# Patient Record
Sex: Female | Born: 1995 | Race: Black or African American | Hispanic: No | Smoking: Never smoker
Health system: Southern US, Community
[De-identification: ages and names within clinical notes are randomized; demographics above are authoritative.]

## PROBLEM LIST (undated history)

## (undated) DIAGNOSIS — J982 Interstitial emphysema: Principal | ICD-10-CM

## (undated) DIAGNOSIS — J45909 Unspecified asthma, uncomplicated: Secondary | ICD-10-CM

---

## 2016-11-03 ENCOUNTER — Emergency Department (HOSPITAL_COMMUNITY)
Admission: EM | Admit: 2016-11-03 | Discharge: 2016-11-03 | Disposition: A | Discharge status: Home / Self Care | Payer: Self-pay | Attending: Emergency Medicine | Admitting: Emergency Medicine

## 2016-11-03 ENCOUNTER — Encounter (HOSPITAL_COMMUNITY): Payer: Self-pay

## 2016-11-03 DIAGNOSIS — J4521 Mild intermittent asthma with (acute) exacerbation: Secondary | ICD-10-CM | POA: Insufficient documentation

## 2016-11-03 HISTORY — DX: Unspecified asthma, uncomplicated: J45.909

## 2016-11-03 MED ORDER — ALBUTEROL SULFATE HFA 108 (90 BASE) MCG/ACT IN AERS
2.0000 | INHALATION_SPRAY | Freq: Once | RESPIRATORY_TRACT | Status: AC
  Administered 2016-11-03: 2 via RESPIRATORY_TRACT
  Filled 2016-11-03: qty 6.7

## 2016-11-03 MED ORDER — IPRATROPIUM BROMIDE 0.02 % IN SOLN
0.5000 mg | Freq: Once | RESPIRATORY_TRACT | Status: AC
  Administered 2016-11-03: 0.5 mg via RESPIRATORY_TRACT
  Filled 2016-11-03: qty 2.5

## 2016-11-03 MED ORDER — PREDNISONE 20 MG PO TABS
60.0000 mg | ORAL_TABLET | Freq: Once | ORAL | Status: AC
  Administered 2016-11-03: 60 mg via ORAL
  Filled 2016-11-03: qty 3

## 2016-11-03 MED ORDER — ALBUTEROL SULFATE (2.5 MG/3ML) 0.083% IN NEBU
5.0000 mg | INHALATION_SOLUTION | Freq: Once | RESPIRATORY_TRACT | Status: AC
  Administered 2016-11-03: 5 mg via RESPIRATORY_TRACT
  Filled 2016-11-03: qty 6

## 2016-11-03 MED ORDER — PREDNISONE 20 MG PO TABS: 60.0000 mg | ORAL_TABLET | Freq: Every day | ORAL | 0 refills | Status: DC

## 2016-11-03 NOTE — Discharge Instructions (Signed)
You may use your albuterol inhaler 2-4 puffs every 2-4 hours as needed for shortness of breath and wheezing.   To find a primary care or specialty doctor please call [phone] or [phone] to access "[place] Find a Doctor Service."  You may also go on the [facility] website at [email or web address]  There are also multiple [facility], [facility], [facility] and [facility] practices throughout the [place] that are frequently accepting new patients. You may find a clinic that is close to your home and contact them.  [facility] -  [address] [place] [id number] [phone]   [facility] -  [address] [place] [id number] [phone]   [facility] [phone]  [place] [phone]

## 2016-11-03 NOTE — ED Triage Notes (Signed)
States woke up about 0200 am with tight chest and asthma flaring up tonight and doesn't have and inhaler with shortness of breath.

## 2016-11-03 NOTE — ED Provider Notes (Signed)
TIME SEEN: 5:33 AM  CHIEF COMPLAINT: Asthma exacerbation  HPI: Patient is a 21 year old female with history of asthma who presents emergency department with chest tightness, wheezing and dry cough that started early this morning. States that this is typical for her this time of year. No fevers. No lower extremity swelling or pain. This is typical of her asthma. Reports that her insurance does not cover her albuterol inhaler or nebulizer treatments.  ROS: See HPI Constitutional: no fever  Eyes: no drainage  ENT: no runny nose   Cardiovascular:  no chest pain  Resp: no SOB  GI: no vomiting GU: no dysuria Integumentary: no rash  Allergy: no hives  Musculoskeletal: no leg swelling  Neurological: no slurred speech ROS otherwise negative  PAST MEDICAL HISTORY/PAST SURGICAL HISTORY:  Past Medical History:  Diagnosis Date  . Asthma     MEDICATIONS:  Prior to Admission medications   Not on File    ALLERGIES:  Allergies  Allergen Reactions  . Sulfa Antibiotics     SOCIAL HISTORY:  Social History  Substance Use Topics  . Smoking status: Never Smoker  . Smokeless tobacco: Never Used  . Alcohol use No    FAMILY HISTORY: History reviewed. No pertinent family history.  EXAM: BP 137/79 (BP Location: Left Arm)   Pulse (!) 107   Temp 98 F (36.7 C) (Oral)   Resp 16   Ht 5' 1.5" (1.562 m)   Wt 81.6 kg (180 lb)   SpO2 97%   BMI 33.46 kg/m  CONSTITUTIONAL: Alert and oriented and responds appropriately to questions. Well-appearing; well-nourished, smiling, laughing, appears comfortable, afebrile and nontoxic HEAD: Normocephalic EYES: Conjunctivae clear, pupils appear equal, EOMI ENT: normal nose; moist mucous membranes NECK: Supple, no meningismus, no nuchal rigidity, no LAD  CARD: Regular and minimally tachycardic; S1 and S2 appreciated; no murmurs, no clicks, no rubs, no gallops RESP: Normal chest excursion without splinting or tachypnea; breath sounds equal bilaterally,  diffuse scattered expiratory wheezes, no rhonchi, no rales, no hypoxia or respiratory distress, speaking full sentences ABD/GI: Normal bowel sounds; non-distended; soft, non-tender, no rebound, no guarding, no peritoneal signs, no hepatosplenomegaly BACK:  The back appears normal and is non-tender to palpation, there is no CVA tenderness EXT: Normal ROM in all joints; non-tender to palpation; no edema; normal capillary refill; no cyanosis, no calf tenderness or swelling    SKIN: Normal color for age and race; warm; no rash NEURO: Moves all extremities equally PSYCH: The patient's mood and manner are appropriate. Grooming and personal hygiene are appropriate.  MEDICAL DECISION MAKING: Patient here with mild asthma exacerbation. She is very well-appearing here. No fevers or cough. Do not feel she needs a chest x-ray. She agrees with this plan. Will treat symptoms with albuterol, Atrovent, prednisone. We'll discharge with albuterol inhaler.  ED PROGRESS: 6:10 AM  Pt's lungs are now completely clear. She reports she feels "20 times better". I feel she is safe to be discharged home and she is comfortable with this plan. She's been given an albuterol inhaler from the emergency department and will discharge with prednisone burst. Given outpatient follow-up information as needed.   At this time, I do not feel there is any life-threatening condition present. I have reviewed and discussed all results (EKG, imaging, lab, urine as appropriate) and exam findings with patient/family. I have reviewed nursing notes and appropriate previous records.  I feel the patient is safe to be discharged home without further emergent workup and can continue workup as an  outpatient as needed. Discussed usual and customary return precautions. Patient/family verbalize understanding and are comfortable with this plan.  Outpatient follow-up has been provided if needed. All questions have been answered.      Kahlen Boyde, Layla MawKristen N,  DO 11/03/16 [phone]

## 2018-09-08 ENCOUNTER — Other Ambulatory Visit: Payer: Self-pay

## 2018-09-08 ENCOUNTER — Emergency Department (HOSPITAL_COMMUNITY)
Admission: EM | Admit: 2018-09-08 | Discharge: 2018-09-08 | Disposition: A | Discharge status: Home / Self Care | Payer: Self-pay | Attending: Ophthalmology | Admitting: Ophthalmology

## 2018-09-08 DIAGNOSIS — R0602 Shortness of breath: Secondary | ICD-10-CM | POA: Insufficient documentation

## 2018-09-08 DIAGNOSIS — R0789 Other chest pain: Secondary | ICD-10-CM | POA: Insufficient documentation

## 2018-09-08 DIAGNOSIS — J4551 Severe persistent asthma with (acute) exacerbation: Secondary | ICD-10-CM | POA: Insufficient documentation

## 2018-09-08 MED ORDER — ALBUTEROL SULFATE HFA 108 (90 BASE) MCG/ACT IN AERS
2.0000 | INHALATION_SPRAY | RESPIRATORY_TRACT | Status: DC | PRN
  Administered 2018-09-08: 2 via RESPIRATORY_TRACT
  Filled 2018-09-08: qty 6.7

## 2018-09-08 MED ORDER — PREDNISONE 10 MG (21) PO TBPK: ORAL_TABLET | Freq: Every day | ORAL | 0 refills | Status: DC

## 2018-09-08 MED ORDER — ALBUTEROL SULFATE (2.5 MG/3ML) 0.083% IN NEBU
5.0000 mg | INHALATION_SOLUTION | Freq: Once | RESPIRATORY_TRACT | Status: AC
  Administered 2018-09-08: 5 mg via RESPIRATORY_TRACT
  Filled 2018-09-08: qty 6

## 2018-09-08 MED ORDER — MONTELUKAST SODIUM 10 MG PO TABS: 10.0000 mg | ORAL_TABLET | Freq: Every day | ORAL | 0 refills | Status: DC

## 2018-09-08 MED ORDER — ALBUTEROL SULFATE (2.5 MG/3ML) 0.083% IN NEBU: 2.5000 mg | INHALATION_SOLUTION | Freq: Four times a day (QID) | RESPIRATORY_TRACT | 12 refills | Status: DC | PRN

## 2018-09-08 MED ORDER — BUDESONIDE-FORMOTEROL FUMARATE 80-4.5 MCG/ACT IN AERO: 2.0000 | INHALATION_SPRAY | Freq: Two times a day (BID) | RESPIRATORY_TRACT | 12 refills | Status: DC

## 2018-09-08 MED ORDER — PREDNISONE 20 MG PO TABS
60.0000 mg | ORAL_TABLET | Freq: Once | ORAL | Status: AC
  Administered 2018-09-08: 07:00:00 60 mg via ORAL
  Filled 2018-09-08: qty 3

## 2018-09-08 MED ORDER — IPRATROPIUM BROMIDE 0.02 % IN SOLN
0.5000 mg | Freq: Once | RESPIRATORY_TRACT | Status: AC
  Administered 2018-09-08: 07:00:00 0.5 mg via RESPIRATORY_TRACT
  Filled 2018-09-08: qty 2.5

## 2018-09-08 NOTE — TOC Transition Note (Signed)
Transition of Care [facility]) - CM/SW Discharge Note   Patient Details  Name: Rachel Montgomery MRN: [id number] Date of Birth: [birth year]  [facility]) CM/SW Contact:  Fuller Mandril, RN Phone Number: 09/08/2018, 9:07 AM   Clinical Narrative:    [facility] to follow for disposition needs.    Final next level of care: Home/Self Care Barriers to Discharge: Barriers Resolved   Patient Goals and CMS Choice Patient states their goals for this hospitalization and ongoing recovery are:: get primary care and medicine to controll asthma      Discharge Placement                       Discharge Plan and Services   Discharge Planning Services: CM Consult, [facility] Program, Follow-up appt scheduled              Lenward Able J. Clydene Laming, RN, BSN, [place] [phone]  [facility] set up appointment with [facility] on 09/22/2018 @ 1:30.  Spoke with pt at bedside and advised to please arrive 15 min early and take a picture ID and your current medications.  Pt verbalizes understanding of keeping appointment.   ED CM consulted by EDP Lawyer for medication assistance. EDCM reviewed chart and spoke with the pt about [facility] MATCH program ($3 co pay for each Rx through [facility] program, does not include refills, 7 day expiration of [place] letter and choice of pharmacies). Pt is eligible for [facility] MATCH program (unable to find pt listed in PROCARE per cardholder name inquiry) and has agreed to accept [facility] under terms discussed. PROCARE information entered. [place] letter completed and provided to pt.  EDCM updated EDP and ED RN.                     Social Determinants of Health (SDOH) Interventions     Readmission Risk Interventions No flowsheet data found.

## 2018-09-08 NOTE — ED Provider Notes (Signed)
[facility] EMERGENCY DEPARTMENT Provider Note   CSN: [id number] Arrival date & time: 09/08/18  0305     History   Chief Complaint Chief Complaint  Patient presents with  . Asthma    HPI Rachel Montgomery is a 23 y.o. female with a history of asthma who presents to the emergency department with a chief complaint of asthma exacerbation.  The patient endorses constant shortness of breath, wheezing, chest tightness, and nonproductive cough that is been worsening over the last 24 hours.  No fever, chills, abdominal pain, nasal congestion, loss of sense of taste or smell. No known or suspected COVID-19 contacts.  She reports 2 prior hospitalizations for asthma.  No previous ICU hospitalizations or intubations.  She reports that she just moved to the area from [place], [place] and ran out of her home Symbicort, albuterol inhaler, albuterol nebulizer, and montelukast.  She is currently uninsured and does not have a primary care provider.      The history is provided by the patient. No language interpreter was used.    Past Medical History:  Diagnosis Date  . Asthma     There are no active problems to display for this patient.   No past surgical history on file.   OB History   No obstetric history on file.      Home Medications    Prior to Admission medications   Medication Sig Start Date End Date Taking? Authorizing Provider  predniSONE (DELTASONE) 20 MG tablet Take 3 tablets (60 mg total) by mouth daily. 11/03/16   Ward, Delice Bison, DO    Family History No family history on file.  Social History Social History   Tobacco Use  . Smoking status: Never Smoker  . Smokeless tobacco: Never Used  Substance Use Topics  . Alcohol use: No  . Drug use: No     Allergies   Sulfa antibiotics   Review of Systems Review of Systems  Constitutional: Negative for activity change, chills and fever.  HENT: Negative for congestion, sore throat  and voice change.   Respiratory: Positive for cough, chest tightness, shortness of breath and wheezing. Negative for apnea, choking and stridor.   Cardiovascular: Negative for chest pain, palpitations and leg swelling.  Gastrointestinal: Negative for abdominal pain, blood in stool, diarrhea, nausea and vomiting.  Genitourinary: Negative for dysuria.  Musculoskeletal: Negative for back pain, myalgias and neck stiffness.  Skin: Negative for rash.  Allergic/Immunologic: Negative for immunocompromised state.  Neurological: Negative for dizziness, seizures, syncope, weakness, numbness and headaches.  Psychiatric/Behavioral: Negative for confusion.     Physical Exam Updated Vital Signs BP 139/78 (BP Location: Right Wrist)   Pulse 85   Temp 98.5 F (36.9 C) (Oral)   Resp 18   LMP 09/03/2018 (Exact Date)   SpO2 98%   Physical Exam Vitals signs and nursing note reviewed.  Constitutional:      General: She is not in acute distress.    Appearance: She is not ill-appearing, toxic-appearing or diaphoretic.  HENT:     Head: Normocephalic.  Eyes:     Conjunctiva/sclera: Conjunctivae normal.  Neck:     Musculoskeletal: Neck supple.  Cardiovascular:     Rate and Rhythm: Normal rate and regular rhythm.     Heart sounds: No murmur. No friction rub. No gallop.   Pulmonary:     Effort: Pulmonary effort is normal. No respiratory distress.     Comments: Lung sounds are diminished with diffuse bilateral wheezes.  No  rhonchi or rales.  No accessory muscle use, nasal flaring, or respiratory distress. Dry cough is heard frequently throughout exam.  Abdominal:     General: There is no distension.     Palpations: Abdomen is soft.  Skin:    General: Skin is warm.     Capillary Refill: Capillary refill takes less than 2 seconds.     Findings: No rash.  Neurological:     Mental Status: She is alert.  Psychiatric:        Behavior: Behavior normal.      ED Treatments / Results  Labs (all labs  ordered are listed, but only abnormal results are displayed) Labs Reviewed - No data to display  EKG None  Radiology No results found.  Procedures Procedures (including critical care time)  Medications Ordered in ED Medications  albuterol (PROVENTIL) (2.5 MG/3ML) 0.083% nebulizer solution 5 mg (has no administration in time range)  ipratropium (ATROVENT) nebulizer solution 0.5 mg (has no administration in time range)  predniSONE (DELTASONE) tablet 60 mg (has no administration in time range)  albuterol (VENTOLIN HFA) 108 (90 Base) MCG/ACT inhaler 2 puff (has no administration in time range)     Initial Impression / Assessment and Plan / ED Course  I have reviewed the triage vital signs and the nursing notes.  Pertinent labs & imaging results that were available during my care of the patient were reviewed by me and considered in my medical decision making (see chart for details).        23 year old female with a history of asthma presenting with asthma exacerbation that is been worsening over the last 24 hours since running out of her home medications.  Symptoms include chest tightness, wheezing, nonproductive cough, and shortness of breath.  [place].  Unfortunately, she does not have medical insurance nor does she have a primary care provider.  SaO2 is in the high 90s at rest.  Lung sounds are diminished throughout and there are sporadic, diffuse, bilateral wheezes.  Will order albuterol nebulizer and plan to reassess.  Albuterol inhaler for home use has also been ordered.  Case management consult has been ordered to help with medication assistance.  The patient will likely need a recheck following nebulizer and ambulated on pulse ox prior to discharge.  [facility] to [facility] and recommended tentatively is 7 to all nights at the end of my shift. Patient presentation, ED course, and plan of care discussed  with review of all pertinent labs and imaging. Please see his/her note for further details regarding further ED course and disposition. Late late late  Final Clinical Impressions(s) / ED Diagnoses   Final diagnoses:  Severe persistent asthma with exacerbation    ED Discharge Orders    None       Barkley BoardsMcDonald, Shanai Lartigue A, PA-C 09/08/18 0723    Dione BoozeGlick, David, MD 09/08/18 [phone]

## 2018-09-08 NOTE — ED Triage Notes (Signed)
Per pt she has hx of asthma and today has been tight today with a sllight wheeze. Pt said no sob, no chest pain. Pt said she has run out of her medication so needed TO be seen to get meds.

## 2018-09-08 NOTE — TOC Progression Note (Signed)
Transition of Care [facility]) - Progression Note    Patient Details  Name: Elysha Milson MRN: [id number] Date of Birth: Tunisia 10, 1997  [facility]) CM/SW Contact  Fuller Mandril, RN Phone Number: 09/08/2018, 9:06 AM  Clinical Narrative:    [facility] met with pt at bedside regarding disposition needs.  Pt states she just moved to the area and has not secured a PCP or insurance.     Expected Discharge Plan: Home/Self Care Barriers to Discharge: Barriers Resolved  Expected Discharge Plan and Services Expected Discharge Plan: Home/Self Care   Discharge Planning Services: CM Consult, [place] Program, Follow-up appt scheduled   Living arrangements for the past 2 months: Single Family Home Expected Discharge Date: 09/08/18                                     Social Determinants of Health (SDOH) Interventions    Readmission Risk Interventions No flowsheet data found.

## 2018-09-08 NOTE — Discharge Instructions (Addendum)
Thank you for allowing me to care for you today in the Emergency Department.   Take your home medications as prescribed.  I have given you a one-month refill for all of your home medications.  Return to the emergency department if you develop respiratory distress, severe shortness of breath, high fevers or shortness of breath, or other new, concerning symptoms.

## 2018-09-08 NOTE — TOC Initial Note (Signed)
Transition of Care [facility]) - Initial/Assessment Note    Patient Details  Name: Rachel Montgomery MRN: [id number] Date of Birth: 1995-08-15  [facility]) CM/SW Contact:    Fuller Mandril, RN Phone Number: 09/08/2018, 9:05 AM  Clinical Narrative:                 [facility] consulted regarding uninsured pt needing PCP and medication assistance.  Expected Discharge Plan: Home/Self Care Barriers to Discharge: Barriers Resolved   Patient Goals and CMS Choice Patient states their goals for this hospitalization and ongoing recovery are:: get primary care and medicine to controll asthma      Expected Discharge Plan and Services Expected Discharge Plan: Home/Self Care   Discharge Planning Services: CM Consult, [facility] Program, Follow-up appt scheduled   Living arrangements for the past 2 months: Single Family Home Expected Discharge Date: 09/08/18                                    Prior Living Arrangements/Services Living arrangements for the past 2 months: Single Family Home Lives with:: Significant Other Patient language and need for interpreter reviewed:: Yes Do you feel safe going back to the place where you live?: Yes      Need for Family Participation in Patient Care: No (Comment) Care giver support system in place?: Yes (comment)   Criminal Activity/Legal Involvement Pertinent to Current Situation/Hospitalization: No - Comment as needed  Activities of Daily Living      Permission Sought/Granted   Permission granted to share information with : Yes, Verbal Permission Granted              Emotional Assessment Appearance:: Appears stated age Attitude/Demeanor/Rapport: Ambitious, Engaged, Self-Confident, Gracious Affect (typically observed): Appropriate, Accepting, Pleasant Orientation: : Oriented to Self, Oriented to  Time, Oriented to Situation, Oriented to Place Alcohol / Substance Use: Never Used Psych Involvement: No (comment)  Admission  diagnosis:  asthma There are no active problems to display for this patient.  PCP:  System, Pcp Not In Pharmacy:  No Pharmacies Listed    Social Determinants of Health (SDOH) Interventions    Readmission Risk Interventions No flowsheet data found.

## 2018-09-22 ENCOUNTER — Inpatient Hospital Stay (INDEPENDENT_AMBULATORY_CARE_PROVIDER_SITE_OTHER): Payer: Self-pay | Admitting: Primary Care

## 2018-12-21 ENCOUNTER — Emergency Department (HOSPITAL_COMMUNITY)
Admission: EM | Admit: 2018-12-21 | Discharge: 2018-12-21 | Disposition: A | Discharge status: Home / Self Care | Payer: Self-pay | Attending: Emergency Medicine | Admitting: Emergency Medicine

## 2018-12-21 ENCOUNTER — Encounter (HOSPITAL_COMMUNITY): Payer: Self-pay | Admitting: Emergency Medicine

## 2018-12-21 ENCOUNTER — Encounter (HOSPITAL_COMMUNITY): Payer: Self-pay

## 2018-12-21 ENCOUNTER — Other Ambulatory Visit: Payer: Self-pay

## 2018-12-21 DIAGNOSIS — J45909 Unspecified asthma, uncomplicated: Secondary | ICD-10-CM | POA: Insufficient documentation

## 2018-12-21 DIAGNOSIS — Z760 Encounter for issue of repeat prescription: Secondary | ICD-10-CM | POA: Insufficient documentation

## 2018-12-21 DIAGNOSIS — Z79899 Other long term (current) drug therapy: Secondary | ICD-10-CM | POA: Insufficient documentation

## 2018-12-21 DIAGNOSIS — Z5321 Procedure and treatment not carried out due to patient leaving prior to being seen by health care provider: Secondary | ICD-10-CM | POA: Insufficient documentation

## 2018-12-21 MED ORDER — ALBUTEROL SULFATE HFA 108 (90 BASE) MCG/ACT IN AERS: 1.0000 | INHALATION_SPRAY | Freq: Four times a day (QID) | RESPIRATORY_TRACT | 0 refills | Status: DC | PRN

## 2018-12-21 MED ORDER — ALBUTEROL SULFATE HFA 108 (90 BASE) MCG/ACT IN AERS
2.0000 | INHALATION_SPRAY | Freq: Once | RESPIRATORY_TRACT | Status: DC
  Filled 2018-12-21: qty 6.7

## 2018-12-21 MED ORDER — BUDESONIDE-FORMOTEROL FUMARATE 80-4.5 MCG/ACT IN AERO: 2.0000 | INHALATION_SPRAY | Freq: Every day | RESPIRATORY_TRACT | 0 refills | Status: AC

## 2018-12-21 NOTE — ED Triage Notes (Signed)
Pt reports "my asthma has been acting up and I don't have any refills on my medication."  "this is my typical asthma"

## 2018-12-21 NOTE — ED Notes (Signed)
Pt verbalized discharge instructions and follow up care. Alert and ambulatory. No IV.  

## 2018-12-21 NOTE — ED Provider Notes (Signed)
[facility]-EMERGENCY DEPT Provider Note   CSN: [id number] Arrival date & time: 12/21/18  0201     History   Chief Complaint Chief Complaint  Patient presents with  . Asthma    HPI Rachel Montgomery is a 23 y.o. female.     The history is provided by the patient and medical records.  Asthma     23 year old female with history of asthma, presenting to the ED requesting refill of her medications.  States she recently moved and ran out of her Symbicort.  She has been having issues getting this refilled as it is over $300 at her pharmacy.  States usually when she has her Symbicort it keeps her at a "steady state" and she does not have flareups quite so often.  She does report her asthma is generally worse this time of year during cold weather.  She has not had any cough, fever, sick contacts, or known Covid exposures.  She is also out of her albuterol inhaler but does have her albuterol nebulizer solution.  Past Medical History:  Diagnosis Date  . Asthma     There are no active problems to display for this patient.   History reviewed. No pertinent surgical history.   OB History   No obstetric history on file.      Home Medications    Prior to Admission medications   Medication Sig Start Date End Date Taking? Authorizing Provider  albuterol (PROVENTIL) (2.5 MG/3ML) 0.083% nebulizer solution Take 3 mLs (2.5 mg total) by nebulization every 6 (six) hours as needed for wheezing or shortness of breath. 09/08/18   McDonald, Mia A, PA-C  budesonide-formoterol (SYMBICORT) 80-4.5 MCG/ACT inhaler Inhale 2 puffs into the lungs 2 (two) times daily. 09/08/18 10/08/18  McDonald, Mia A, PA-C  montelukast (SINGULAIR) 10 MG tablet Take 1 tablet (10 mg total) by mouth at bedtime. 09/08/18 10/08/18  McDonald, Mia A, PA-C  predniSONE (STERAPRED UNI-PAK 21 TAB) 10 MG (21) TBPK tablet Take by mouth daily. Take 6 tabs by mouth daily  for 2 days, then 5 tabs for 2 days, then  4 tabs for 2 days, then 3 tabs for 2 days, 2 tabs for 2 days, then 1 tab by mouth daily for 2 days 09/08/18   Frederik Pear A, PA-C    Family History No family history on file.  Social History Social History   Tobacco Use  . Smoking status: Never Smoker  . Smokeless tobacco: Never Used  Substance Use Topics  . Alcohol use: No  . Drug use: No     Allergies   Sulfa antibiotics   Review of Systems Review of Systems  Constitutional:       Med refill  All other systems reviewed and are negative.    Physical Exam Updated Vital Signs BP 120/88 (BP Location: Left Arm)   Pulse 93   Temp 98.1 F (36.7 C) (Oral)   Resp 14   Ht 5\' 1"  (1.549 m)   Wt 96.6 kg   SpO2 98%   BMI 40.25 kg/m   Physical Exam Vitals signs and nursing note reviewed.  Constitutional:      Appearance: She is well-developed.  HENT:     Head: Normocephalic and atraumatic.  Eyes:     Conjunctiva/sclera: Conjunctivae normal.     Pupils: Pupils are equal, round, and reactive to light.  Neck:     Musculoskeletal: Normal range of motion.  Cardiovascular:     Rate and Rhythm: Normal rate  and regular rhythm.     Heart sounds: Normal heart sounds.  Pulmonary:     Effort: Pulmonary effort is normal. No respiratory distress.     Breath sounds: Normal breath sounds. No stridor. No wheezing or rhonchi.     Comments: Lungs clear, no distress, speaking in full sentences without difficulty Abdominal:     General: Bowel sounds are normal.     Palpations: Abdomen is soft.  Musculoskeletal: Normal range of motion.  Skin:    General: Skin is warm and dry.  Neurological:     Mental Status: She is alert and oriented to person, place, and time.      ED Treatments / Results  Labs (all labs ordered are listed, but only abnormal results are displayed) Labs Reviewed - No data to display  EKG None  Radiology No results found.  Procedures Procedures (including critical care time)  Medications Ordered  in ED Medications  albuterol (VENTOLIN HFA) 108 (90 Base) MCG/ACT inhaler 2 puff (has no administration in time range)     Initial Impression / Assessment and Plan / ED Course  I have reviewed the triage vital signs and the nursing notes.  Pertinent labs & imaging results that were available during my care of the patient were reviewed by me and considered in my medical decision making (see chart for details).  23 year old female here requesting refills of her medications for her asthma.  Has been out of them for a few days now and has had some worsening symptoms.  She is afebrile and nontoxic.  Her lungs are currently clear during time of exam and she is not displaying any signs of respiratory distress.  She has not had any noted fevers, sick contacts, or known Covid exposures.  Does admit she has had some difficulty refilling her medications due to out-of-pocket cost.  I have printed goodRx coupons for her to make these more affordable and given her outpatient prescriptions for same.  Recommended close follow-up with PCP.  Return here for any new or acute changes.  Final Clinical Impressions(s) / ED Diagnoses   Final diagnoses:  Medication refill    ED Discharge Orders         Ordered    albuterol (VENTOLIN HFA) 108 (90 Base) MCG/ACT inhaler  Every 6 hours PRN     12/21/18 0221    budesonide-formoterol (SYMBICORT) 80-4.5 MCG/ACT inhaler  Daily     12/21/18 0221           Larene Pickett, PA-C 12/21/18 0932    Orpah Greek, MD 12/21/18 [phone]

## 2018-12-21 NOTE — ED Triage Notes (Signed)
Patient arrived stating since Monday morning she has been having trouble managing her asthma due to running out of her inhaler. Patient in no distress in triage.

## 2018-12-21 NOTE — ED Notes (Signed)
Pt stated her mother is taking her to another hospital.

## 2018-12-21 NOTE — Discharge Instructions (Signed)
Take the prescribed medication as directed.  Use the coupons I have printed for you, they are good at any [facility] pharmacy. Follow-up with your primary care doctor. Return to the ED for new or worsening symptoms.

## 2018-12-21 NOTE — ED Notes (Signed)
Pt ambulated from triage to room 17 without assistance. Gait steady  

## 2018-12-26 ENCOUNTER — Encounter (HOSPITAL_COMMUNITY): Payer: Self-pay | Admitting: Emergency Medicine

## 2018-12-26 ENCOUNTER — Other Ambulatory Visit: Payer: Self-pay

## 2018-12-26 DIAGNOSIS — J4521 Mild intermittent asthma with (acute) exacerbation: Secondary | ICD-10-CM | POA: Insufficient documentation

## 2018-12-26 MED ORDER — ALBUTEROL SULFATE (2.5 MG/3ML) 0.083% IN NEBU
5.0000 mg | INHALATION_SOLUTION | Freq: Once | RESPIRATORY_TRACT | Status: AC
  Administered 2018-12-26: 5 mg via RESPIRATORY_TRACT
  Filled 2018-12-26: qty 6

## 2018-12-26 NOTE — ED Triage Notes (Signed)
Pt arriving with SOB due to asthma. Pt reports symptoms began earlier today and she has taken multiple breathing tx without relief.

## 2018-12-26 NOTE — ED Notes (Signed)
Breathing tx complete. Pt reports improvement of symptoms. Following assessment, pt stable and appropriate to wait in lobby. Informed pt that if she begins to feel her symptoms worsening, to inform staff immediately.

## 2018-12-27 ENCOUNTER — Emergency Department (HOSPITAL_COMMUNITY)
Admission: EM | Admit: 2018-12-27 | Discharge: 2018-12-27 | Disposition: A | Discharge status: Home / Self Care | Payer: Self-pay | Attending: Emergency Medicine | Admitting: Emergency Medicine

## 2018-12-27 DIAGNOSIS — J4521 Mild intermittent asthma with (acute) exacerbation: Secondary | ICD-10-CM

## 2018-12-27 MED ORDER — ALBUTEROL SULFATE (2.5 MG/3ML) 0.083% IN NEBU
5.0000 mg | INHALATION_SOLUTION | Freq: Once | RESPIRATORY_TRACT | Status: AC
  Administered 2018-12-27: 5 mg via RESPIRATORY_TRACT
  Filled 2018-12-27: qty 6

## 2018-12-27 MED ORDER — PREDNISONE 20 MG PO TABS
60.0000 mg | ORAL_TABLET | Freq: Once | ORAL | Status: AC
  Administered 2018-12-27: 60 mg via ORAL
  Filled 2018-12-27: qty 3

## 2018-12-27 MED ORDER — PREDNISONE 20 MG PO TABS: ORAL_TABLET | ORAL | 0 refills | Status: DC

## 2018-12-27 MED ORDER — IPRATROPIUM BROMIDE 0.02 % IN SOLN
0.5000 mg | Freq: Once | RESPIRATORY_TRACT | Status: AC
  Administered 2018-12-27: 0.5 mg via RESPIRATORY_TRACT
  Filled 2018-12-27: qty 2.5

## 2018-12-27 NOTE — ED Provider Notes (Signed)
[facility]-EMERGENCY DEPT Provider Note   CSN: [id number] Arrival date & time: 12/26/18  2148     History Chief Complaint  Patient presents with  . Asthma    Rachel Montgomery is a 23 y.o. female.  The history is provided by the patient and medical records.  Asthma Associated symptoms include shortness of breath.    23 year old female here with asthma exacerbation.  I personally evaluated patient last week for similar related complaints.  She was given prescriptions for her daily inhalers at last visit, however does not get paid until Friday so has not been able to fill them.  States her symptoms seem a little worse, has been doing nebs about every hour and 1/2 to 2 hours.  She was given 2 nebs on arrival to ED and states she is feeling significantly better.  She has not had any fever, chills, sweats, hemoptysis, or other concerning symptoms.  She has not had any chest pain.  Symptoms feel consistent with her asthma.  She is not had any sick contacts or known Covid exposures.  Past Medical History:  Diagnosis Date  . Asthma     There are no problems to display for this patient.   History reviewed. No pertinent surgical history.   OB History   No obstetric history on file.     History reviewed. No pertinent family history.  Social History   Tobacco Use  . Smoking status: Never Smoker  . Smokeless tobacco: Never Used  Substance Use Topics  . Alcohol use: No  . Drug use: No    Home Medications Prior to Admission medications   Medication Sig Start Date End Date Taking? Authorizing Provider  albuterol (VENTOLIN HFA) 108 (90 Base) MCG/ACT inhaler Inhale 1-2 puffs into the lungs every 6 (six) hours as needed for wheezing. 12/21/18   Garlon Hatchet, PA-C  budesonide-formoterol (SYMBICORT) 80-4.5 MCG/ACT inhaler Inhale 2 puffs into the lungs daily. 12/21/18   Garlon Hatchet, PA-C  montelukast (SINGULAIR) 10 MG tablet Take 1 tablet (10 mg total)  by mouth at bedtime. 09/08/18 10/08/18  McDonald, Mia A, PA-C  predniSONE (STERAPRED UNI-PAK 21 TAB) 10 MG (21) TBPK tablet Take by mouth daily. Take 6 tabs by mouth daily  for 2 days, then 5 tabs for 2 days, then 4 tabs for 2 days, then 3 tabs for 2 days, 2 tabs for 2 days, then 1 tab by mouth daily for 2 days 09/08/18   McDonald, Mia A, PA-C    Allergies    Sulfa antibiotics  Review of Systems   Review of Systems  Respiratory: Positive for shortness of breath.   All other systems reviewed and are negative.   Physical Exam Updated Vital Signs BP (!) 138/98 (BP Location: Right Arm)   Pulse (!) 128   Temp 99.9 F (37.7 C) (Oral)   Resp (!) 22   Ht 5\' 1"  (1.549 m)   Wt 96.6 kg   LMP 12/07/2018 (Approximate)   SpO2 97%   BMI 40.25 kg/m   Physical Exam Vitals and nursing note reviewed.  Constitutional:      Appearance: She is well-developed.  HENT:     Head: Normocephalic and atraumatic.  Eyes:     Conjunctiva/sclera: Conjunctivae normal.     Pupils: Pupils are equal, round, and reactive to light.  Cardiovascular:     Rate and Rhythm: Regular rhythm. Tachycardia present.     Heart sounds: Normal heart sounds.  Pulmonary:  Effort: Pulmonary effort is normal.     Breath sounds: Wheezing present.     Comments: End expiratory wheezes noted, worse at the bases, able to speak in full sentences, O2 sats 100% Abdominal:     General: Bowel sounds are normal.     Palpations: Abdomen is soft.  Musculoskeletal:        General: Normal range of motion.     Cervical back: Normal range of motion.  Skin:    General: Skin is warm and dry.  Neurological:     Mental Status: She is alert and oriented to person, place, and time.     ED Results / Procedures / Treatments   Labs (all labs ordered are listed, but only abnormal results are displayed) Labs Reviewed - No data to display  EKG None  Radiology No results found.  Procedures Procedures (including critical care  time)  Medications Ordered in ED Medications  albuterol (PROVENTIL) (2.5 MG/3ML) 0.083% nebulizer solution 5 mg (5 mg Nebulization Given 12/26/18 2209)  albuterol (PROVENTIL) (2.5 MG/3ML) 0.083% nebulizer solution 5 mg (5 mg Nebulization Given 12/27/18 0124)  albuterol (PROVENTIL) (2.5 MG/3ML) 0.083% nebulizer solution 5 mg (5 mg Nebulization Given 12/27/18 0212)  ipratropium (ATROVENT) nebulizer solution 0.5 mg (0.5 mg Nebulization Given 12/27/18 0212)  predniSONE (DELTASONE) tablet 60 mg (60 mg Oral Given 12/27/18 1610)    ED Course  I have reviewed the triage vital signs and the nursing notes.  Pertinent labs & imaging results that were available during my care of the patient were reviewed by me and considered in my medical decision making (see chart for details).    MDM Rules/Calculators/A&P  23 year old female here with asthma exacerbation.  Seen last week for similar.  Has not yet been able to fill her regularly scheduled inhalers.  She is in NAD here.  She is already received 2 nebulizer treatments by time of my evaluation and states she is feeling significantly better.  She still continues to have some mild end expiratory wheezes but is in no acute distress.  As this is her second visit within a week, she may benefit from prednisone taper.  Will give additional neb here as well as first dose of prednisone.  Will reassess.  After additional nebs and prednisone, lung sounds have cleared.  Vitals remained stable on room air.  She is somewhat tachycardic, however suspect this is from the albuterol.  Feel she stable for discharge home.  We will continue prednisone taper along with scheduled nebs.  I encouraged her to pick up her other inhalers as soon as possible.  She will need to follow-up closely with PCP.  Return here for any new or acute changes.  Final Clinical Impression(s) / ED Diagnoses Final diagnoses:  Mild intermittent asthma with exacerbation    Rx / DC Orders ED  Discharge Orders         Ordered    predniSONE (DELTASONE) 20 MG tablet     12/27/18 0343           Larene Pickett, PA-C 12/27/18 9604    Ripley Fraise, MD 12/27/18 [phone]

## 2018-12-27 NOTE — Discharge Instructions (Signed)
Start the prednisone tomorrow morning, you have already had today's dose. Fill your other inhalers as soon as you can. Can continue nebs at home when needed or every 4-6 hours for maintenance. Follow-up with your primary care doctor. Return here for any new/acute changes.

## 2019-01-31 ENCOUNTER — Emergency Department (HOSPITAL_COMMUNITY): Payer: Self-pay

## 2019-01-31 ENCOUNTER — Encounter (HOSPITAL_COMMUNITY): Payer: Self-pay | Admitting: Emergency Medicine

## 2019-01-31 ENCOUNTER — Emergency Department (HOSPITAL_COMMUNITY)
Admission: EM | Admit: 2019-01-31 | Discharge: 2019-01-31 | Disposition: A | Discharge status: Home / Self Care | Payer: Self-pay | Attending: Emergency Medicine | Admitting: Emergency Medicine

## 2019-01-31 ENCOUNTER — Other Ambulatory Visit: Payer: Self-pay

## 2019-01-31 DIAGNOSIS — Z79899 Other long term (current) drug therapy: Secondary | ICD-10-CM | POA: Insufficient documentation

## 2019-01-31 DIAGNOSIS — Z760 Encounter for issue of repeat prescription: Secondary | ICD-10-CM | POA: Insufficient documentation

## 2019-01-31 DIAGNOSIS — Z882 Allergy status to sulfonamides status: Secondary | ICD-10-CM | POA: Insufficient documentation

## 2019-01-31 DIAGNOSIS — J4521 Mild intermittent asthma with (acute) exacerbation: Secondary | ICD-10-CM

## 2019-01-31 DIAGNOSIS — Z20822 Contact with and (suspected) exposure to covid-19: Secondary | ICD-10-CM | POA: Insufficient documentation

## 2019-01-31 LAB — CBC WITH DIFFERENTIAL/PLATELET
Abs Immature Granulocytes: 0.05 10*3/uL (ref 0.00–0.07)
Basophils Absolute: 0.1 10*3/uL (ref 0.0–0.1)
Basophils Relative: 1 %
Eosinophils Absolute: 0.4 10*3/uL (ref 0.0–0.5)
Eosinophils Relative: 4 %
HCT: 42.6 % (ref 36.0–46.0)
Hemoglobin: 13 g/dL (ref 12.0–15.0)
Immature Granulocytes: 1 %
Lymphocytes Relative: 11 %
Lymphs Abs: 1.1 10*3/uL (ref 0.7–4.0)
MCH: 24.1 pg — ABNORMAL LOW (ref 26.0–34.0)
MCHC: 30.5 g/dL (ref 30.0–36.0)
MCV: 79 fL — ABNORMAL LOW (ref 80.0–100.0)
Monocytes Absolute: 0.2 10*3/uL (ref 0.1–1.0)
Monocytes Relative: 2 %
Neutro Abs: 8.5 10*3/uL — ABNORMAL HIGH (ref 1.7–7.7)
Neutrophils Relative %: 81 %
Platelets: 423 10*3/uL — ABNORMAL HIGH (ref 150–400)
RBC: 5.39 MIL/uL — ABNORMAL HIGH (ref 3.87–5.11)
RDW: 17.1 % — ABNORMAL HIGH (ref 11.5–15.5)
WBC: 10.4 10*3/uL (ref 4.0–10.5)
nRBC: 0.2 % (ref 0.0–0.2)

## 2019-01-31 LAB — COMPREHENSIVE METABOLIC PANEL
ALT: 24 U/L (ref 0–44)
AST: 25 U/L (ref 15–41)
Albumin: 4.1 g/dL (ref 3.5–5.0)
Alkaline Phosphatase: 82 U/L (ref 38–126)
Anion gap: 8 (ref 5–15)
BUN: 7 mg/dL (ref 6–20)
CO2: 21 mmol/L — ABNORMAL LOW (ref 22–32)
Calcium: 9.3 mg/dL (ref 8.9–10.3)
Chloride: 107 mmol/L (ref 98–111)
Creatinine, Ser: 0.86 mg/dL (ref 0.44–1.00)
GFR calc Af Amer: >60 mL/min (ref >60)
GFR calc non Af Amer: >60 mL/min (ref >60)
Glucose, Bld: 98 mg/dL (ref 70–99)
Potassium: 4.3 mmol/L (ref 3.5–5.1)
Sodium: 136 mmol/L (ref 135–145)
Total Bilirubin: 0.4 mg/dL (ref 0.3–1.2)
Total Protein: 8.2 g/dL — ABNORMAL HIGH (ref 6.5–8.1)

## 2019-01-31 LAB — RESPIRATORY PANEL BY RT PCR (FLU A&B, COVID)
Influenza A by PCR: NEGATIVE
Influenza B by PCR: NEGATIVE
SARS Coronavirus 2 by RT PCR: NEGATIVE

## 2019-01-31 MED ORDER — SODIUM CHLORIDE 0.9 % IV BOLUS: 1000.0000 mL | Freq: Once | INTRAVENOUS | Status: DC

## 2019-01-31 MED ORDER — IPRATROPIUM BROMIDE HFA 17 MCG/ACT IN AERS
2.0000 | INHALATION_SPRAY | Freq: Once | RESPIRATORY_TRACT | Status: AC
  Administered 2019-01-31: 19:00:00 2 via RESPIRATORY_TRACT
  Filled 2019-01-31: qty 12.9

## 2019-01-31 MED ORDER — ALBUTEROL SULFATE HFA 108 (90 BASE) MCG/ACT IN AERS
8.0000 | INHALATION_SPRAY | Freq: Once | RESPIRATORY_TRACT | Status: AC
  Administered 2019-01-31: 8 via RESPIRATORY_TRACT
  Filled 2019-01-31: qty 6.7

## 2019-01-31 MED ORDER — PREDNISONE 20 MG PO TABS
60.0000 mg | ORAL_TABLET | Freq: Once | ORAL | Status: AC
  Administered 2019-01-31: 18:00:00 60 mg via ORAL
  Filled 2019-01-31: qty 3

## 2019-01-31 MED ORDER — PREDNISONE 20 MG PO TABS: ORAL_TABLET | ORAL | 0 refills | Status: DC

## 2019-01-31 NOTE — ED Provider Notes (Signed)
[facility]-EMERGENCY DEPT Provider Note   CSN: [id number] Arrival date & time: 01/31/19  1711     History Chief Complaint  Patient presents with  . Asthma  . Medication Refill    Rachel Montgomery is a 24 y.o. female.  Patient complains of shortness of breath.  Patient has a history of asthma and is out of her inhalers  The history is provided by the patient. No language interpreter was used.  Asthma This is a recurrent problem. The current episode started 1 to 2 hours ago. The problem occurs constantly. The problem has not changed since onset.Associated symptoms include shortness of breath. Pertinent negatives include no chest pain, no abdominal pain and no headaches. Nothing aggravates the symptoms. Nothing relieves the symptoms. She has tried nothing for the symptoms.  Medication Refill      Past Medical History:  Diagnosis Date  . Asthma     There are no problems to display for this patient.   History reviewed. No pertinent surgical history.   OB History   No obstetric history on file.     No family history on file.  Social History   Tobacco Use  . Smoking status: Never Smoker  . Smokeless tobacco: Never Used  Substance Use Topics  . Alcohol use: No  . Drug use: No    Home Medications Prior to Admission medications   Medication Sig Start Date End Date Taking? Authorizing Provider  albuterol (PROVENTIL) (2.5 MG/3ML) 0.083% nebulizer solution Take 2.5 mg by nebulization every 6 (six) hours as needed for wheezing or shortness of breath.    [provider]  albuterol (VENTOLIN HFA) 108 (90 Base) MCG/ACT inhaler Inhale 1-2 puffs into the lungs every 6 (six) hours as needed for wheezing. 12/21/18   Garlon Hatchet, PA-C  budesonide-formoterol (SYMBICORT) 80-4.5 MCG/ACT inhaler Inhale 2 puffs into the lungs daily. 12/21/18   Garlon Hatchet, PA-C  predniSONE (DELTASONE) 20 MG tablet 2 tabs po daily x 3 days 01/31/19   Bethann Berkshire, MD    Allergies    Sulfa antibiotics  Review of Systems   Review of Systems  Constitutional: Negative for appetite change and fatigue.  HENT: Negative for congestion, ear discharge and sinus pressure.   Eyes: Negative for discharge.  Respiratory: Positive for shortness of breath. Negative for cough.   Cardiovascular: Negative for chest pain.  Gastrointestinal: Negative for abdominal pain and diarrhea.  Genitourinary: Negative for frequency and hematuria.  Musculoskeletal: Negative for back pain.  Skin: Negative for rash.  Neurological: Negative for seizures and headaches.  Psychiatric/Behavioral: Negative for hallucinations.    Physical Exam Updated Vital Signs BP 117/64 (BP Location: Right Arm)   Pulse (!) 113   Temp 98.6 F (37 C) (Oral)   Resp (!) 21   SpO2 98%   Physical Exam Vitals and nursing note reviewed.  Constitutional:      Appearance: She is well-developed.  HENT:     Head: Normocephalic.     Nose: Nose normal.  Eyes:     General: No scleral icterus.    Conjunctiva/sclera: Conjunctivae normal.  Neck:     Thyroid: No thyromegaly.  Cardiovascular:     Rate and Rhythm: Regular rhythm. Tachycardia present.     Heart sounds: No murmur. No friction rub. No gallop.   Pulmonary:     Breath sounds: No stridor. Wheezing present. No rales.  Chest:     Chest wall: No tenderness.  Abdominal:  General: There is no distension.     Tenderness: There is no abdominal tenderness. There is no rebound.  Musculoskeletal:        General: Normal range of motion.     Cervical back: Neck supple.  Lymphadenopathy:     Cervical: No cervical adenopathy.  Skin:    Findings: No erythema or rash.  Neurological:     Mental Status: She is alert and oriented to person, place, and time.     Motor: No abnormal muscle tone.     Coordination: Coordination normal.  Psychiatric:        Behavior: Behavior normal.     ED Results / Procedures / Treatments   Labs (all  labs ordered are listed, but only abnormal results are displayed) Labs Reviewed  CBC WITH DIFFERENTIAL/PLATELET - Abnormal; Notable for the following components:      Result Value   RBC 5.39 (*)    MCV 79.0 (*)    MCH 24.1 (*)    RDW 17.1 (*)    Platelets 423 (*)    Neutro Abs 8.5 (*)    All other components within normal limits  COMPREHENSIVE METABOLIC PANEL - Abnormal; Notable for the following components:   CO2 21 (*)    Total Protein 8.2 (*)    All other components within normal limits  RESPIRATORY PANEL BY RT PCR (FLU A&B, COVID)    EKG None  Radiology DG Chest Port 1 View  Result Date: 01/31/2019 CLINICAL DATA:  Audible wheezing. EXAM: PORTABLE CHEST 1 VIEW COMPARISON:  None. FINDINGS: The heart size and mediastinal contours are within normal limits. Both lungs are clear. The visualized skeletal structures are unremarkable. IMPRESSION: No active disease. Electronically Signed   By: Virgina Norfolk M.D.   On: 01/31/2019 19:14    Procedures Procedures (including critical care time)  Medications Ordered in ED Medications  sodium chloride 0.9 % bolus 1,000 mL (has no administration in time range)  albuterol (VENTOLIN HFA) 108 (90 Base) MCG/ACT inhaler 8 puff (8 puffs Inhalation Given 01/31/19 1828)  predniSONE (DELTASONE) tablet 60 mg (60 mg Oral Given 01/31/19 1828)  ipratropium (ATROVENT HFA) inhaler 2 puff (2 puffs Inhalation Given 01/31/19 1831)    ED Course  I have reviewed the triage vital signs and the nursing notes.  Pertinent labs & imaging results that were available during my care of the patient were reviewed by me and considered in my medical decision making (see chart for details).    MDM Rules/Calculators/A&P                      Patient given albuterol and Atrovent inhaler.  She was also given prednisone.  Her wheezing improved and at discharge she was no longer having wheezing and her pulse ox was normal on room air.  Patient was sent home with  albuterol and Atrovent inhalers and also prednisone Final Clinical Impression(s) / ED Diagnoses Final diagnoses:  Mild intermittent asthma with exacerbation    Rx / DC Orders ED Discharge Orders         Ordered    predniSONE (DELTASONE) 20 MG tablet     01/31/19 2150           Milton Ferguson, MD 01/31/19 2155

## 2019-01-31 NOTE — ED Triage Notes (Signed)
Pt reports that she has asthma and needs refill on inhaler and nebulizer medications-been out for couple weeks.

## 2019-01-31 NOTE — Discharge Instructions (Addendum)
Use the albuterol inhaler and the Atrovent inhaler every 6 hours as needed.  Follow-up if any problem

## 2019-04-16 ENCOUNTER — Other Ambulatory Visit: Payer: Self-pay

## 2019-04-16 ENCOUNTER — Encounter (HOSPITAL_COMMUNITY): Payer: Self-pay | Admitting: Emergency Medicine

## 2019-04-16 ENCOUNTER — Emergency Department (HOSPITAL_COMMUNITY)
Admission: EM | Admit: 2019-04-16 | Discharge: 2019-04-16 | Disposition: A | Discharge status: Home / Self Care | Payer: Self-pay | Attending: Emergency Medicine | Admitting: Emergency Medicine

## 2019-04-16 ENCOUNTER — Emergency Department (HOSPITAL_COMMUNITY): Payer: Self-pay

## 2019-04-16 DIAGNOSIS — J45901 Unspecified asthma with (acute) exacerbation: Secondary | ICD-10-CM | POA: Insufficient documentation

## 2019-04-16 LAB — CBC
HCT: 40.6 % (ref 36.0–46.0)
Hemoglobin: 12.6 g/dL (ref 12.0–15.0)
MCH: 24 pg — ABNORMAL LOW (ref 26.0–34.0)
MCHC: 31 g/dL (ref 30.0–36.0)
MCV: 77.2 fL — ABNORMAL LOW (ref 80.0–100.0)
Platelets: 417 10*3/uL — ABNORMAL HIGH (ref 150–400)
RBC: 5.26 MIL/uL — ABNORMAL HIGH (ref 3.87–5.11)
RDW: 17.6 % — ABNORMAL HIGH (ref 11.5–15.5)
WBC: 11.4 10*3/uL — ABNORMAL HIGH (ref 4.0–10.5)
nRBC: 0 % (ref 0.0–0.2)

## 2019-04-16 LAB — BASIC METABOLIC PANEL
Anion gap: 9 (ref 5–15)
BUN: 12 mg/dL (ref 6–20)
CO2: 20 mmol/L — ABNORMAL LOW (ref 22–32)
Calcium: 8.7 mg/dL — ABNORMAL LOW (ref 8.9–10.3)
Chloride: 110 mmol/L (ref 98–111)
Creatinine, Ser: 0.78 mg/dL (ref 0.44–1.00)
GFR calc Af Amer: >60 mL/min (ref >60)
GFR calc non Af Amer: >60 mL/min (ref >60)
Glucose, Bld: 111 mg/dL — ABNORMAL HIGH (ref 70–99)
Potassium: 4 mmol/L (ref 3.5–5.1)
Sodium: 139 mmol/L (ref 135–145)

## 2019-04-16 MED ORDER — IPRATROPIUM BROMIDE 0.02 % IN SOLN
0.5000 mg | Freq: Once | RESPIRATORY_TRACT | Status: AC
  Administered 2019-04-16: 0.5 mg via RESPIRATORY_TRACT
  Filled 2019-04-16: qty 2.5

## 2019-04-16 MED ORDER — PREDNISONE 50 MG PO TABS: 50.0000 mg | ORAL_TABLET | Freq: Every day | ORAL | 0 refills | Status: AC

## 2019-04-16 MED ORDER — SODIUM CHLORIDE 0.9 % IV BOLUS
1000.0000 mL | Freq: Once | INTRAVENOUS | Status: AC
  Administered 2019-04-16: 1000 mL via INTRAVENOUS

## 2019-04-16 MED ORDER — ALBUTEROL SULFATE (2.5 MG/3ML) 0.083% IN NEBU
5.0000 mg | INHALATION_SOLUTION | Freq: Once | RESPIRATORY_TRACT | Status: AC
  Administered 2019-04-16: 5 mg via RESPIRATORY_TRACT
  Filled 2019-04-16: qty 6

## 2019-04-16 NOTE — ED Notes (Signed)
Pt ambulated roughly 300 ft with no assistance or abnormality of gait. Pt O2 saturation maintained between 94 - 84%. Pulse 130. Pt had no complaints of SOB, chest tightness, or dizziness. Pt states she felt fine. Pt O2 saturation resting in bed 95% with RR @ 22

## 2019-04-16 NOTE — Discharge Instructions (Signed)
Take prednisone as prescribed. Use albuterol every 4 hours for the next 2 days.  After this, use as needed for shortness of breath. Follow-up with me [place] [facility] listed below.  It is important that you establish care with a primary care provider, as they may be able to help with medication assistance. Return to the emergency room if you develop fever, severe chest pain, increased difficulty breathing, any new, worsening, or concerning symptoms.

## 2019-04-16 NOTE — ED Notes (Signed)
Pt ambulated roughly 250 ft with out assistance or abnormality of gait. Pt O2 maintained a steady 96%. No complaints expressed by pt.

## 2019-04-16 NOTE — ED Provider Notes (Signed)
[facility] DEPT Provider Note   CSN: [id number] Arrival date & time: 04/16/19  0004     History Chief Complaint  Patient presents with  . Shortness of Breath    Rachel Montgomery is a 24 y.o. female presenting for evaluation of shortness of breath.  Patient states she has had gradually worsening shortness of breath over the past 3 days.  She ran out of her Symbicort for 5 days ago, this is usually a trigger for an asthma exacerbation.  Patient states she was managing her symptoms at home with her nebulizer until today, when her shortness of breath got a lot worse.  She reports associated cough, which is normal for her asthma flares.  She denies fevers, chills, sore throat, chest pain, nausea, vomiting, domino pain, urinary symptoms, normal bowel movements.  She denies leg pain or swelling.  Besides asthma, she has history of anemia, no other medical problems.  She does not have a primary care doctor at this time, she has no insurance.  She does have a refill left on her Symbicort prescription, but has been unable to pick it up due to cost.  Additional history obtained from triage note.  Patient was given 10 mg of albuterol via continuous neb, 2 DuoNeb treatments, 125 mg Solu-Medrol, and 2 mg mag IV with EMS.  Patient states she is feeling much better after EMS treatment.  HPI     Past Medical History:  Diagnosis Date  . Asthma     There are no problems to display for this patient.   No past surgical history on file.   OB History   No obstetric history on file.     No family history on file.  Social History   Tobacco Use  . Smoking status: Never Smoker  . Smokeless tobacco: Never Used  Substance Use Topics  . Alcohol use: Yes    Alcohol/week: 1.0 standard drinks    Types: 1 Glasses of wine per week  . Drug use: No    Home Medications Prior to Admission medications   Medication Sig Start Date End Date Taking? Authorizing Provider   albuterol (PROVENTIL) (2.5 MG/3ML) 0.083% nebulizer solution Take 2.5 mg by nebulization every 6 (six) hours as needed for wheezing or shortness of breath.   Yes [provider]  albuterol (VENTOLIN HFA) 108 (90 Base) MCG/ACT inhaler Inhale 1-2 puffs into the lungs every 6 (six) hours as needed for wheezing. 12/21/18  Yes Larene Pickett, PA-C  budesonide-formoterol [facility]) 80-4.5 MCG/ACT inhaler Inhale 2 puffs into the lungs daily. Patient not taking: Reported on 04/16/2019 12/21/18   Larene Pickett, PA-C  predniSONE (DELTASONE) 50 MG tablet Take 1 tablet (50 mg total) by mouth daily for 5 days. 04/16/19 04/21/19  Augusten Lipkin, PA-C    Allergies    Sulfa antibiotics  Review of Systems   Review of Systems  Respiratory: Positive for cough and shortness of breath.   All other systems reviewed and are negative.   Physical Exam Updated Vital Signs BP 126/71   Pulse (!) 116   Temp 97.6 F (36.4 C) (Oral)   Resp (!) 21   Ht 5' 1.5" (1.562 m)   Wt 100 kg   LMP 04/15/2019   SpO2 93%   BMI 40.98 kg/m   Physical Exam Vitals and nursing note reviewed.  Constitutional:      General: She is not in acute distress.    Appearance: She is well-developed. She is obese.  Comments: Obese female resting comfortably in the bed in no acute distress  HENT:     Head: Normocephalic and atraumatic.  Eyes:     Conjunctiva/sclera: Conjunctivae normal.     Pupils: Pupils are equal, round, and reactive to light.  Cardiovascular:     Rate and Rhythm: Regular rhythm. Tachycardia present.     Comments: tachycardiac around 120 Pulmonary:     Effort: Pulmonary effort is normal. No respiratory distress.     Breath sounds: Wheezing present.     Comments: Expiratory wheezes in all fields.  Speaking full sentences.  No signs of respiratory distress.  Sats stable on room air. Abdominal:     General: There is no distension.     Palpations: Abdomen is soft. There is no mass.     Tenderness:  There is no abdominal tenderness. There is no guarding or rebound.  Musculoskeletal:        General: Normal range of motion.     Cervical back: Normal range of motion and neck supple.     Right lower leg: No edema.     Left lower leg: No edema.  Skin:    General: Skin is warm and dry.     Capillary Refill: Capillary refill takes less than 2 seconds.  Neurological:     Mental Status: She is alert and oriented to person, place, and time.     ED Results / Procedures / Treatments   Labs (all labs ordered are listed, but only abnormal results are displayed) Labs Reviewed  CBC - Abnormal; Notable for the following components:      Result Value   WBC 11.4 (*)    RBC 5.26 (*)    MCV 77.2 (*)    MCH 24.0 (*)    RDW 17.6 (*)    Platelets 417 (*)    All other components within normal limits  BASIC METABOLIC PANEL - Abnormal; Notable for the following components:   CO2 20 (*)    Glucose, Bld 111 (*)    Calcium 8.7 (*)    All other components within normal limits    EKG EKG Interpretation  Date/Time:  Saturday April 16 2019 00:22:20 EDT Ventricular Rate:  132 PR Interval:    QRS Duration: 80 QT Interval:  315 QTC Calculation: 467 R Axis:   45 Text Interpretation: Sinus tachycardia Low voltage, precordial leads Borderline T abnormalities, diffuse leads No old tracing to compare Confirmed by Rochele Raring [phone]) on 04/16/2019 12:38:31 AM   Radiology DG Chest 2 View  Result Date: 04/16/2019 CLINICAL DATA:  Shortness of breath. EXAM: CHEST - 2 VIEW COMPARISON:  None. FINDINGS: The heart size and mediastinal contours are within normal limits. Both lungs are clear. The visualized skeletal structures are unremarkable. IMPRESSION: No active cardiopulmonary disease. Electronically Signed   By: Katherine Mantle M.D.   On: 04/16/2019 00:35    Procedures Procedures (including critical care time)  Medications Ordered in ED Medications  sodium chloride 0.9 % bolus 1,000 mL (1,000 mLs  Intravenous New Bag/Given 04/16/19 0152)  albuterol (PROVENTIL) (2.5 MG/3ML) 0.083% nebulizer solution 5 mg (5 mg Nebulization Given 04/16/19 0152)  ipratropium (ATROVENT) nebulizer solution 0.5 mg (0.5 mg Nebulization Given 04/16/19 0152)    ED Course  I have reviewed the triage vital signs and the nursing notes.  Pertinent labs & imaging results that were available during my care of the patient were reviewed by me and considered in my medical decision making (see chart for details).  MDM Rules/Calculators/A&P                      Patient presenting for evaluation of worsening shortness of breath.  On exam, patient appears nontoxic.  She received extensive treatment with EMS.  She is mildly tachycardic, likely secondary to albuterol.  She does have continued expiratory wheezes in all fields, although I have a high suspicion this is her baseline.  Doubt infectious etiology, as patient is without fever, but will order cxr to r/o pna as exam is difficult due to wheezing.  Doubt PE.  Will obtain labs to ensure no significant anemia or metabolic abnormalities.  Will ambulate patient to ensure no hypoxia.  We will continue to monitor heart rate to look for improvement.  Chest x-ray viewed interpreted by me, no pneumonia pneumothorax, effusion.  Labs interpreted by me, overall reassuring.  No anemia.  Patient ambulated, sats dropped down to 84%.  Patient had no complaints of shortness of breath or wheezing.  Sats promptly returned to mid 90s at rest.  Will give another breathing treatment and reassess.  If patient does not improve with another breathing treatment, she may need to be admitted for hypoxia in the setting of asthma exacerbation.  On reassessment after repeat breathing treatment, patient states she is still feeling improved.  Patient ambulated, sats maintained in the mid 90s.  As she is feeling better, and sats are improved, will plan for discharge.  We will give patient steroids to help with  flare.  Encouraged patient to follow-up with [facility] for PCP and medication assistance. At this time, pt appears safe for d/c. Return precautions given. Pt states she understands and agrees to plan.   Final Clinical Impression(s) / ED Diagnoses Final diagnoses:  Exacerbation of asthma, unspecified asthma severity, unspecified whether persistent    Rx / DC Orders ED Discharge Orders         Ordered    predniSONE (DELTASONE) 50 MG tablet  Daily     04/16/19 0343           Alveria Apley, PA-C 04/16/19 0351    Ward, Layla Maw, DO 04/16/19 [phone]

## 2019-05-03 ENCOUNTER — Emergency Department (HOSPITAL_COMMUNITY): Payer: Self-pay

## 2019-05-03 ENCOUNTER — Emergency Department (HOSPITAL_COMMUNITY)
Admission: EM | Admit: 2019-05-03 | Discharge: 2019-05-03 | Disposition: A | Discharge status: Home / Self Care | Payer: Self-pay | Attending: Emergency Medicine | Admitting: Emergency Medicine

## 2019-05-03 DIAGNOSIS — Z20822 Contact with and (suspected) exposure to covid-19: Secondary | ICD-10-CM | POA: Insufficient documentation

## 2019-05-03 DIAGNOSIS — J4541 Moderate persistent asthma with (acute) exacerbation: Secondary | ICD-10-CM | POA: Insufficient documentation

## 2019-05-03 DIAGNOSIS — Z79899 Other long term (current) drug therapy: Secondary | ICD-10-CM | POA: Insufficient documentation

## 2019-05-03 LAB — POC SARS CORONAVIRUS 2 AG -  ED: SARS Coronavirus 2 Ag: NEGATIVE

## 2019-05-03 LAB — BASIC METABOLIC PANEL
Anion gap: 10 (ref 5–15)
BUN: 6 mg/dL (ref 6–20)
CO2: 20 mmol/L — ABNORMAL LOW (ref 22–32)
Calcium: 8.8 mg/dL — ABNORMAL LOW (ref 8.9–10.3)
Chloride: 108 mmol/L (ref 98–111)
Creatinine, Ser: 0.92 mg/dL (ref 0.44–1.00)
GFR calc Af Amer: >60 mL/min (ref >60)
GFR calc non Af Amer: >60 mL/min (ref >60)
Glucose, Bld: 105 mg/dL — ABNORMAL HIGH (ref 70–99)
Potassium: 3.6 mmol/L (ref 3.5–5.1)
Sodium: 138 mmol/L (ref 135–145)

## 2019-05-03 LAB — CBC WITH DIFFERENTIAL/PLATELET
Abs Immature Granulocytes: 0.03 10*3/uL (ref 0.00–0.07)
Basophils Absolute: 0.1 10*3/uL (ref 0.0–0.1)
Basophils Relative: 1 %
Eosinophils Absolute: 0.8 10*3/uL — ABNORMAL HIGH (ref 0.0–0.5)
Eosinophils Relative: 7 %
HCT: 39.9 % (ref 36.0–46.0)
Hemoglobin: 12.3 g/dL (ref 12.0–15.0)
Immature Granulocytes: 0 %
Lymphocytes Relative: 13 %
Lymphs Abs: 1.3 10*3/uL (ref 0.7–4.0)
MCH: 24 pg — ABNORMAL LOW (ref 26.0–34.0)
MCHC: 30.8 g/dL (ref 30.0–36.0)
MCV: 77.8 fL — ABNORMAL LOW (ref 80.0–100.0)
Monocytes Absolute: 0.3 10*3/uL (ref 0.1–1.0)
Monocytes Relative: 3 %
Neutro Abs: 7.7 10*3/uL (ref 1.7–7.7)
Neutrophils Relative %: 76 %
Platelets: 377 10*3/uL (ref 150–400)
RBC: 5.13 MIL/uL — ABNORMAL HIGH (ref 3.87–5.11)
RDW: 18.2 % — ABNORMAL HIGH (ref 11.5–15.5)
WBC: 10.2 10*3/uL (ref 4.0–10.5)
nRBC: 0 % (ref 0.0–0.2)

## 2019-05-03 LAB — I-STAT BETA HCG BLOOD, ED (MC, WL, AP ONLY): I-stat hCG, quantitative: <5 m[IU]/mL (ref <5)

## 2019-05-03 MED ORDER — ALBUTEROL (5 MG/ML) CONTINUOUS INHALATION SOLN
10.0000 mg/h | INHALATION_SOLUTION | Freq: Once | RESPIRATORY_TRACT | Status: DC
  Filled 2019-05-03: qty 20

## 2019-05-03 MED ORDER — PREDNISONE 10 MG PO TABS: 40.0000 mg | ORAL_TABLET | Freq: Every day | ORAL | 0 refills | Status: AC

## 2019-05-03 MED ORDER — ALBUTEROL SULFATE (2.5 MG/3ML) 0.083% IN NEBU
5.0000 mg | INHALATION_SOLUTION | Freq: Once | RESPIRATORY_TRACT | Status: AC
  Administered 2019-05-03: 5 mg via RESPIRATORY_TRACT
  Filled 2019-05-03: qty 6

## 2019-05-03 MED ORDER — MAGNESIUM SULFATE 2 GM/50ML IV SOLN
2.0000 g | Freq: Once | INTRAVENOUS | Status: AC
  Administered 2019-05-03: 2 g via INTRAVENOUS
  Filled 2019-05-03: qty 50

## 2019-05-03 MED ORDER — METHYLPREDNISOLONE SODIUM SUCC 125 MG IJ SOLR
125.0000 mg | Freq: Once | INTRAMUSCULAR | Status: AC
  Administered 2019-05-03: 125 mg via INTRAVENOUS
  Filled 2019-05-03: qty 2

## 2019-05-03 NOTE — ED Provider Notes (Signed)
[facility]-EMERGENCY DEPT Provider Note   CSN: [id number] Arrival date & time: 05/03/19  1412     History Chief Complaint  Patient presents with  . Asthma    Rachel Montgomery is a 24 y.o. female.  Patient is a 24 year old female with past medical history of obesity and asthma presenting to the emergency department for asthma exacerbation.  Reports worsening wheezing over the last couple months.  Reports that last night she did not sleep at all because she was waking up every 1 hour for a nebulizer treatment.  She denies any fever, chills or Covid exposure.  She was given 125 Solu-Medrol, 2 g of magnesium and nebulizer treatments in route with minimal relief.        Past Medical History:  Diagnosis Date  . Asthma     There are no problems to display for this patient.   No past surgical history on file.   OB History   No obstetric history on file.     No family history on file.  Social History   Tobacco Use  . Smoking status: Never Smoker  . Smokeless tobacco: Never Used  Substance Use Topics  . Alcohol use: Yes    Alcohol/week: 1.0 standard drinks    Types: 1 Glasses of wine per week  . Drug use: No    Home Medications Prior to Admission medications   Medication Sig Start Date End Date Taking? Authorizing Provider  albuterol (PROVENTIL) (2.5 MG/3ML) 0.083% nebulizer solution Take 2.5 mg by nebulization every 6 (six) hours as needed for wheezing or shortness of breath.   Yes [provider]  albuterol (VENTOLIN HFA) 108 (90 Base) MCG/ACT inhaler Inhale 1-2 puffs into the lungs every 6 (six) hours as needed for wheezing. 12/21/18  Yes Garlon Hatchet, PA-C  budesonide-formoterol [facility]) 80-4.5 MCG/ACT inhaler Inhale 2 puffs into the lungs daily. 12/21/18  Yes Garlon Hatchet, PA-C  predniSONE (DELTASONE) 10 MG tablet Take 4 tablets (40 mg total) by mouth daily for 5 days. 05/03/19 05/08/19  Arlyn Dunning, PA-C     Allergies    Sulfa antibiotics  Review of Systems   Review of Systems  Constitutional: Negative for appetite change, chills and fever.  HENT: Negative for ear pain and sore throat.   Eyes: Negative for pain and visual disturbance.  Respiratory: Positive for cough and shortness of breath.   Cardiovascular: Negative for chest pain and palpitations.  Gastrointestinal: Negative for abdominal pain and vomiting.  Genitourinary: Negative for dysuria and hematuria.  Musculoskeletal: Negative for arthralgias and back pain.  Skin: Negative for color change and rash.  Neurological: Negative for seizures and syncope.  All other systems reviewed and are negative.   Physical Exam Updated Vital Signs BP 110/61   Pulse (!) 130   Temp 97.7 F (36.5 C) (Oral)   Resp 20   Ht 5\' 1"  (1.549 m)   Wt 99.8 kg   LMP 04/15/2019   SpO2 96%   BMI 41.57 kg/m   Physical Exam Vitals and nursing note reviewed.  Constitutional:      Appearance: Normal appearance. She is obese.  HENT:     Head: Normocephalic.  Eyes:     Conjunctiva/sclera: Conjunctivae normal.  Cardiovascular:     Rate and Rhythm: Regular rhythm. Tachycardia present.  Pulmonary:     Effort: Pulmonary effort is normal.     Breath sounds: Wheezing and rhonchi present.     Comments: Prolonged expiratory phase.  Patient  with accessory muscle use.  Speaking in 3 word sentences Skin:    General: Skin is dry.  Neurological:     Mental Status: She is alert.  Psychiatric:        Mood and Affect: Mood normal.     ED Results / Procedures / Treatments   Labs (all labs ordered are listed, but only abnormal results are displayed) Labs Reviewed  BASIC METABOLIC PANEL - Abnormal; Notable for the following components:      Result Value   CO2 20 (*)    Glucose, Bld 105 (*)    Calcium 8.8 (*)    All other components within normal limits  CBC WITH DIFFERENTIAL/PLATELET - Abnormal; Notable for the following components:   RBC 5.13 (*)     MCV 77.8 (*)    MCH 24.0 (*)    RDW 18.2 (*)    Eosinophils Absolute 0.8 (*)    All other components within normal limits  I-STAT BETA HCG BLOOD, ED ([facility], [facility], [facility] ONLY)  POC SARS CORONAVIRUS 2 AG -  ED    EKG None  Radiology DG Chest Port 1 View  Result Date: 05/03/2019 CLINICAL DATA:  Cough, shortness of breath. EXAM: PORTABLE CHEST 1 VIEW COMPARISON:  Chest radiograph 04/16/2019. FINDINGS: Heart size within normal limits. No evidence of airspace consolidation within the lungs. No evidence of pleural effusion or pneumothorax. No acute bony abnormality. IMPRESSION: No evidence of acute cardiopulmonary abnormality. Electronically Signed   By: Jackey Loge DO   On: 05/03/2019 15:09    Procedures Procedures (including critical care time)  Medications Ordered in ED Medications  methylPREDNISolone sodium succinate (SOLU-MEDROL) 125 mg/2 mL injection 125 mg (125 mg Intravenous Given 05/03/19 1453)  magnesium sulfate IVPB 2 g 50 mL (0 g Intravenous Stopped 05/03/19 1720)  albuterol (PROVENTIL) (2.5 MG/3ML) 0.083% nebulizer solution 5 mg (5 mg Nebulization Given 05/03/19 1548)    ED Course  I have reviewed the triage vital signs and the nursing notes.  Pertinent labs & imaging results that were available during my care of the patient were reviewed by me and considered in my medical decision making (see chart for details).  Clinical Course as of May 04 2126  Tue May 03, 2019  1527 Patient presenting with asthma exacerbation. Tachycardic, tachypnic and significant wheezing on arrival. She is now significntly improved after duonebs, solumedrol, magnesium. Normal WBC and chest xray. Covid negative. Will continue to monitor. If she continues to improve will d/c home   [KM]  1721 Patient remains improved.  Discussed admission versus outpatient follow-up.  Patient feels comfortable going home with her albuterol inhaler and prednisone prescription.  She remains mildly tachycardic but this can be  attributed to significant amounts of albuterol and prednisone given today.  Overall she is now well-appearing and stable for discharge.   [KM]    Clinical Course User Index [KM] Jeral Pinch   MDM Rules/Calculators/A&P                      Based on review of vitals, medical screening exam, lab work and/or imaging, there does not appear to be an acute, emergent etiology for the patient's symptoms. Counseled pt on good return precautions and encouraged both PCP and ED follow-up as needed.  Prior to discharge, I also discussed incidental imaging findings with patient in detail and advised appropriate, recommended follow-up in detail.  Clinical Impression: 1. Moderate persistent asthma with exacerbation     Disposition:  Discharge  Prior to providing a prescription for a controlled substance, I independently reviewed the patient's recent prescription history on the [facility]. The patient had no recent or regular prescriptions and was deemed appropriate for a brief, less than 3 day prescription of narcotic for acute analgesia.  This note was prepared with assistance of Systems analyst. Occasional wrong-word or sound-a-like substitutions may have occurred due to the inherent limitations of voice recognition software.  Final Clinical Impression(s) / ED Diagnoses Final diagnoses:  Moderate persistent asthma with exacerbation    Rx / DC Orders ED Discharge Orders         Ordered    predniSONE (DELTASONE) 10 MG tablet  Daily     05/03/19 1723           Kristine Royal 05/04/19 2129    Varney Biles, MD 05/05/19 [phone]

## 2019-05-03 NOTE — Discharge Instructions (Signed)
Thank you for allowing me to care for you today. Please return to the emergency department if you have new or worsening symptoms. Take your medications as instructed.  ° °

## 2019-05-03 NOTE — ED Triage Notes (Signed)
Patient BIB [facility], history of asthma with prior admission, home nebs and inhalers not working. En route, patient had duoneb x2, 1 atrovent, 125 solumedrol, 2g mag, patient still has wheezing . MD notified.

## 2019-05-03 NOTE — ED Notes (Signed)
Respiratory made aware Continuous neb needed

## 2019-05-15 ENCOUNTER — Encounter (HOSPITAL_COMMUNITY): Payer: Self-pay

## 2019-05-15 ENCOUNTER — Other Ambulatory Visit: Payer: Self-pay

## 2019-05-15 ENCOUNTER — Emergency Department (HOSPITAL_COMMUNITY)
Admission: EM | Admit: 2019-05-15 | Discharge: 2019-05-15 | Disposition: A | Discharge status: Home / Self Care | Payer: Self-pay | Attending: Emergency Medicine | Admitting: Emergency Medicine

## 2019-05-15 DIAGNOSIS — Z5321 Procedure and treatment not carried out due to patient leaving prior to being seen by health care provider: Secondary | ICD-10-CM | POA: Insufficient documentation

## 2019-05-15 DIAGNOSIS — J45909 Unspecified asthma, uncomplicated: Secondary | ICD-10-CM | POA: Insufficient documentation

## 2019-05-15 MED ORDER — ALBUTEROL SULFATE HFA 108 (90 BASE) MCG/ACT IN AERS
2.0000 | INHALATION_SPRAY | Freq: Once | RESPIRATORY_TRACT | Status: AC
  Administered 2019-05-15: 2 via RESPIRATORY_TRACT
  Filled 2019-05-15: qty 6.7

## 2019-05-15 NOTE — ED Triage Notes (Signed)
Pt reports that she feels like she needs an inhaler. She has a hx of asthma and states that she has been wheezing since around noon. She states that her home neb is broken. No distress.

## 2019-05-15 NOTE — ED Notes (Signed)
Verbal order from Ranc[place], MD for 2 puff of albuterol inhaler instead of standing order neb treatment.

## 2019-05-15 NOTE — ED Notes (Signed)
Pt returned her stickers to registration d/t wait time.  

## 2019-05-29 ENCOUNTER — Encounter (HOSPITAL_COMMUNITY): Payer: Self-pay

## 2019-05-29 ENCOUNTER — Other Ambulatory Visit: Payer: Self-pay

## 2019-05-29 ENCOUNTER — Emergency Department (HOSPITAL_COMMUNITY)
Admission: EM | Admit: 2019-05-29 | Discharge: 2019-05-29 | Disposition: A | Discharge status: Home / Self Care | Payer: Self-pay | Attending: Emergency Medicine | Admitting: Emergency Medicine

## 2019-05-29 DIAGNOSIS — J4521 Mild intermittent asthma with (acute) exacerbation: Secondary | ICD-10-CM | POA: Insufficient documentation

## 2019-05-29 MED ORDER — ALBUTEROL SULFATE (2.5 MG/3ML) 0.083% IN NEBU
5.0000 mg | INHALATION_SOLUTION | Freq: Once | RESPIRATORY_TRACT | Status: AC
  Administered 2019-05-29: 5 mg via RESPIRATORY_TRACT
  Filled 2019-05-29: qty 6

## 2019-05-29 MED ORDER — IPRATROPIUM BROMIDE 0.02 % IN SOLN
0.5000 mg | Freq: Once | RESPIRATORY_TRACT | Status: AC
  Administered 2019-05-29: 0.5 mg via RESPIRATORY_TRACT
  Filled 2019-05-29: qty 2.5

## 2019-05-29 MED ORDER — PREDNISONE 20 MG PO TABS: ORAL_TABLET | ORAL | 0 refills | Status: AC

## 2019-05-29 MED ORDER — PREDNISONE 20 MG PO TABS
60.0000 mg | ORAL_TABLET | Freq: Once | ORAL | Status: AC
  Administered 2019-05-29: 60 mg via ORAL
  Filled 2019-05-29: qty 3

## 2019-05-29 MED ORDER — ALBUTEROL SULFATE HFA 108 (90 BASE) MCG/ACT IN AERS
2.0000 | INHALATION_SPRAY | Freq: Once | RESPIRATORY_TRACT | Status: AC
  Administered 2019-05-29: 2 via RESPIRATORY_TRACT
  Filled 2019-05-29: qty 6.7

## 2019-05-29 MED ORDER — ALBUTEROL SULFATE HFA 108 (90 BASE) MCG/ACT IN AERS: 1.0000 | INHALATION_SPRAY | Freq: Four times a day (QID) | RESPIRATORY_TRACT | 0 refills | Status: AC | PRN

## 2019-05-29 NOTE — ED Triage Notes (Signed)
Pt reports a hx of asthma. States that she started wheezing earlier this evening and her albuterol neb didn't help. Pt is leaning forward to breath better.

## 2019-05-29 NOTE — ED Provider Notes (Signed)
[facility]-EMERGENCY DEPT Provider Note   CSN: [id number] Arrival date & time: 05/29/19  0118     History Chief Complaint  Patient presents with  . Asthma    Rachel Montgomery is a 24 y.o. female.  The history is provided by the patient and medical records.  Asthma Associated symptoms include shortness of breath.    24 year old female with history of asthma, presenting to the ED with asthma attack.  Patient reports her daughter has a [place] pig at home who is currently shedding.  She picked it up today so they could clean the cage and afterwards started having trouble with her breathing.  She reports wheezing, chest tightness, difficulty taking a deep breath.  This all feels consistent with her prior asthma exacerbations.  She did try a home neb with her albuterol solution but did not have any significant relief.  She denies any cough, fever, or other infectious symptoms.  Past Medical History:  Diagnosis Date  . Asthma     There are no problems to display for this patient.   History reviewed. No pertinent surgical history.   OB History   No obstetric history on file.     History reviewed. No pertinent family history.  Social History   Tobacco Use  . Smoking status: Never Smoker  . Smokeless tobacco: Never Used  Substance Use Topics  . Alcohol use: Yes    Alcohol/week: 1.0 standard drinks    Types: 1 Glasses of wine per week  . Drug use: No    Home Medications Prior to Admission medications   Medication Sig Start Date End Date Taking? Authorizing Provider  albuterol (PROVENTIL) (2.5 MG/3ML) 0.083% nebulizer solution Take 2.5 mg by nebulization every 6 (six) hours as needed for wheezing or shortness of breath.    [provider]  albuterol (VENTOLIN HFA) 108 (90 Base) MCG/ACT inhaler Inhale 1-2 puffs into the lungs every 6 (six) hours as needed for wheezing. 12/21/18   Garlon Hatchet, PA-C  budesonide-formoterol (SYMBICORT)  80-4.5 MCG/ACT inhaler Inhale 2 puffs into the lungs daily. 12/21/18   Garlon Hatchet, PA-C    Allergies    Sulfa antibiotics  Review of Systems   Review of Systems  Respiratory: Positive for shortness of breath and wheezing.   All other systems reviewed and are negative.   Physical Exam Updated Vital Signs BP 120/60 (BP Location: Right Arm)   Pulse (!) 115   Temp 98.9 F (37.2 C) (Oral)   Resp (!) 22   SpO2 98%   Physical Exam Vitals and nursing note reviewed.  Constitutional:      Appearance: She is well-developed.  HENT:     Head: Normocephalic and atraumatic.  Eyes:     Conjunctiva/sclera: Conjunctivae normal.     Pupils: Pupils are equal, round, and reactive to light.  Cardiovascular:     Rate and Rhythm: Normal rate and regular rhythm.     Heart sounds: Normal heart sounds.  Pulmonary:     Effort: Pulmonary effort is normal.     Breath sounds: Wheezing present.     Comments: Diffuse inspiratory and expiratory wheezes, she is able to speak in sentences, mildly tachypneic Abdominal:     General: Bowel sounds are normal.     Palpations: Abdomen is soft.  Musculoskeletal:        General: Normal range of motion.     Cervical back: Normal range of motion.  Skin:    General: Skin is warm  and dry.  Neurological:     Mental Status: She is alert and oriented to person, place, and time.     ED Results / Procedures / Treatments   Labs (all labs ordered are listed, but only abnormal results are displayed) Labs Reviewed - No data to display  EKG None  Radiology No results found.  Procedures Procedures (including critical care time)  Medications Ordered in ED Medications  albuterol (VENTOLIN HFA) 108 (90 Base) MCG/ACT inhaler 2 puff (has no administration in time range)  albuterol (PROVENTIL) (2.5 MG/3ML) 0.083% nebulizer solution 5 mg (5 mg Nebulization Given 05/29/19 0239)  ipratropium (ATROVENT) nebulizer solution 0.5 mg (0.5 mg Nebulization Given 05/29/19  0239)  predniSONE (DELTASONE) tablet 60 mg (60 mg Oral Given 05/29/19 0239)    ED Course  I have reviewed the triage vital signs and the nursing notes.  Pertinent labs & imaging results that were available during my care of the patient were reviewed by me and considered in my medical decision making (see chart for details).    MDM Rules/Calculators/A&P  24 year old female presenting to the ED with asthma attack.  States this began today after holding her daughter's [place] pig.  She has a known allergy to pet dander.  She is afebrile and nontoxic.  She does have diffuse inspiratory and expiratory wheezes.  She was given DuoNeb and dose of prednisone here with good improvement of her symptoms.  On recheck, still with some very minimal wheeze at the left base but patient states she would like to go ahead and go home.  Feel this is reasonable.  She was given albuterol inhaler here along with prescription for same and prednisone taper.  Good encouraged to continue home nebs every 4-6 hours or when needed.  Close follow-up with PCP.  Return here for any new or acute changes.  Final Clinical Impression(s) / ED Diagnoses Final diagnoses:  Mild intermittent asthma with exacerbation    Rx / DC Orders ED Discharge Orders         Ordered    predniSONE (DELTASONE) 20 MG tablet     05/29/19 0324    albuterol (VENTOLIN HFA) 108 (90 Base) MCG/ACT inhaler  Every 6 hours PRN     05/29/19 0324           Larene Pickett, PA-C 05/29/19 0340    Mesner, Corene Cornea, MD 05/29/19 0345

## 2019-05-29 NOTE — Discharge Instructions (Signed)
Take the prescribed medication as directed. °Follow-up with your primary care doctor. °Return to the ED for new or worsening symptoms. °

## 2020-03-20 ENCOUNTER — Encounter: Payer: MEDICAID | Attending: Nurse Practitioner

## 2020-06-21 ENCOUNTER — Inpatient Hospital Stay
Admit: 2020-06-21 | Discharge: 2020-06-21 | Disposition: A | Discharge status: Home / Self Care | Payer: PRIVATE HEALTH INSURANCE | Attending: Emergency Medicine

## 2020-06-21 DIAGNOSIS — J4521 Mild intermittent asthma with (acute) exacerbation: Secondary | ICD-10-CM

## 2020-06-21 MED ORDER — IPRATROPIUM BROMIDE 17 MCG/ACTUATION AEROSOL INHALER
17 mcg/actuation | Freq: Four times a day (QID) | RESPIRATORY_TRACT | 0 refills | Status: AC | PRN
Start: 2020-06-21 — End: 2021-02-13

## 2020-06-21 MED ORDER — ALBUTEROL SULFATE HFA 90 MCG/ACTUATION AEROSOL INHALER
90 mcg/actuation | RESPIRATORY_TRACT | 0 refills | Status: AC | PRN
Start: 2020-06-21 — End: 2020-07-01

## 2020-06-21 MED ORDER — IPRATROPIUM-ALBUTEROL 2.5 MG-0.5 MG/3 ML NEB SOLUTION
2.5 mg-0.5 mg/3 ml | RESPIRATORY_TRACT | Status: AC
Start: 2020-06-21 — End: 2020-06-21
  Administered 2020-06-21: 19:00:00 via RESPIRATORY_TRACT

## 2020-06-21 MED ORDER — ALBUTEROL SULFATE 0.083 % (0.83 MG/ML) SOLN FOR INHALATION
2.5 mg /3 mL (0.083 %) | RESPIRATORY_TRACT | Status: AC
Start: 2020-06-21 — End: 2020-06-21
  Administered 2020-06-21: 20:00:00 via RESPIRATORY_TRACT

## 2020-06-21 MED ORDER — MONTELUKAST 10 MG TAB
10 mg | ORAL_TABLET | Freq: Every day | ORAL | 0 refills | Status: AC
Start: 2020-06-21 — End: 2020-07-01

## 2020-06-21 MED FILL — ALBUTEROL SULFATE 0.083 % (0.83 MG/ML) SOLN FOR INHALATION: 2.5 mg /3 mL (0.083 %) | RESPIRATORY_TRACT | Qty: 1

## 2020-06-21 MED FILL — IPRATROPIUM-ALBUTEROL 2.5 MG-0.5 MG/3 ML NEB SOLUTION: 2.5 mg-0.5 mg/3 ml | RESPIRATORY_TRACT | Qty: 3

## 2020-06-21 NOTE — ED Notes (Signed)
 Stephanie Murray is a 25 y.o. female that was discharged in stable condition.  The patients diagnosis, condition and treatment were explained to  patient and aftercare instructions were given.  The patient verbalized understanding. Patient armband removed and shredded.

## 2020-06-21 NOTE — ED Notes (Signed)
Pt c/o wheezing and tightness in chest x a couple of days.  Pt states she has recently relocated and has not established care with a PCP.  States h/o asthma.  Uses albuterol inhaler and duoneb, which have both run out.    Pt reports being [redacted] weeks pregnant.

## 2020-06-21 NOTE — ED Provider Notes (Signed)
ED Provider Notes by Nolon Bussingodriguez, Nusayba Cadenas S, PA-C at 06/21/20 1452                Author: Nolon Bussingodriguez, Adrik Khim S, PA-C  Service: Emergency Medicine  Author Type: Physician Assistant       Filed: 06/21/20 1611  Date of Service: 06/21/20 1452  Status: Attested           Editor: Gifford Shaveodriguez, Sloane Junkin S, PA-C (Physician Assistant)  Cosigner: Malachy MoodKosteli, Aliki, MD at 06/22/20 1019          Attestation signed by Malachy MoodKosteli, Aliki, MD at 06/22/20 1019          I was personally available for consultation in the emergency department by the mid level provider who evaluated and treated the patient. I have not personally  evaluated the patient.      Malachy MoodAliki Kosteli, MD, PhD                                    [name] is a 25 year old female with past medical history of asthma who presents with an acute asthma exacerbation.  She said she just moved to the area and she is out of her inhalers that she usually takes.  She usually takes a DuoNeb or Ventolin  inhaler as needed and she is also on Trileptal which she has not had for few days that usually helps her not need her inhalers all the time but she has been out of that as well.  She is currently [redacted] weeks pregnant.  She is able to speak in complete sentences  during the interview.      Nursing nurses regarding the HPI and triage nursing notes were reviewed.         No current facility-administered medications for this encounter.          Current Outpatient Medications        Medication  Sig         ?  albuterol-ipratropium (DUO-NEB) 2.5 mg-0.5 mg/3 ml nebu  3 mL by Nebulization route every four (4) hours as needed for Wheezing.     ?  fluticasone/umeclidin/vilanter (TRELEGY ELLIPTA IN)  Take 2 Puffs by inhalation two (2) times a day. 200mcg     ?  ipratropium (Atrovent HFA) 17 mcg/actuation inhaler  Take 1 Puff by inhalation every six (6) hours as needed for Wheezing.     ?  albuterol (PROVENTIL HFA, VENTOLIN HFA, PROAIR HFA) 90 mcg/actuation inhaler  Take 2 Puffs by inhalation  every four (4) hours as needed for Wheezing for up to 10 days.         ?  montelukast (Singulair) 10 mg tablet  Take 1 Tablet by mouth daily for 10 days.             Past Medical History:        Diagnosis  Date         ?  Asthma             History reviewed. No pertinent surgical history.      History reviewed. No pertinent family history.        Social History          Socioeconomic History         ?  Marital status:  SINGLE              Spouse name:  Not on file         ?  Number of children:  Not on file     ?  Years of education:  Not on file     ?  Highest education level:  Not on file       Occupational History        ?  Not on file       Tobacco Use         ?  Smoking status:  Never Smoker     ?  Smokeless tobacco:  Never Used       Substance and Sexual Activity         ?  Alcohol use:  Not Currently     ?  Drug use:  Never     ?  Sexual activity:  Not on file        Other Topics  Concern        ?  Not on file       Social History Narrative        ?  Not on file          Social Determinants of Health          Financial Resource Strain:         ?  Difficulty of Paying Living Expenses: Not on file       Food Insecurity:         ?  Worried About Running Out of Food in the Last Year: Not on file     ?  Ran Out of Food in the Last Year: Not on file       Transportation Needs:         ?  Lack of Transportation (Medical): Not on file     ?  Lack of Transportation (Non-Medical): Not on file       Physical Activity:         ?  Days of Exercise per Week: Not on file     ?  Minutes of Exercise per Session: Not on file       Stress:         ?  Feeling of Stress : Not on file       Social Connections:         ?  Frequency of Communication with Friends and Family: Not on file     ?  Frequency of Social Gatherings with Friends and Family: Not on file     ?  Attends Religious Services: Not on file     ?  Active Member of Clubs or Organizations: Not on file     ?  Attends Banker Meetings: Not on file     ?   Marital Status: Not on file       Intimate Partner Violence:         ?  Fear of Current or Ex-Partner: Not on file     ?  Emotionally Abused: Not on file     ?  Physically Abused: Not on file     ?  Sexually Abused: Not on file       Housing Stability:         ?  Unable to Pay for Housing in the Last Year: Not on file     ?  Number of Places Lived in the Last Year: Not on file        ?  Unstable Housing in the Last Year: Not on file  Allergies        Allergen  Reactions         ?  Sulfa (Sulfonamide Antibiotics)  Hives           Patient's primary care provider (as noted in EPIC):  None      Review of Systems    Constitutional: Negative.     HENT: Negative.     Respiratory: Positive for chest tightness and wheezing .     Cardiovascular: Negative.     Gastrointestinal: Negative.     Genitourinary: Negative.     Musculoskeletal: Negative.     Skin: Negative.     Neurological: Negative.           Visit Vitals      BP  (!) 116/48     Pulse  (!) 109     Temp  98.8 ??F (37.1 ??C)     Resp  22     Ht  5\' 1"  (1.549 m)     Wt  96.2 kg (212 lb)     SpO2  100%        BMI  40.06 kg/m??           PHYSICAL EXAM:      CONSTITUTIONAL:  Alert, in no apparent distress;  well developed;  well nourished.   HEAD:  Normocephalic, atraumatic.   EYES:  EOMI.  Non-icteric sclera.  Normal conjunctiva.   ENTM:  Nose:  no rhinorrhea.  Throat:  no erythema or exudate, mucous membranes moist.   NECK:  No JVD.  Supple      RESPIRATORY:  Expiratory wheezes with good air movement.  No rales or rhonchi.        CARDIOVASCULAR:  Regular rate and rhythm.  No murmurs, rubs, or gallops.   GI:  Normal bowel sounds, abdomen soft and non-tender.  No rebound or guarding.   BACK:  Non-tender.   UPPER EXT:  Normal inspection.   LOWER EXT:  No edema, no calf tenderness.  Distal pulses intact.   NEURO:  Moves all four extremities, and grossly normal motor exam.   SKIN:  No rashes;  Normal for age.   PSYCH:  Alert and normal affect.      DIFFERENTIAL  DIAGNOSES/ MEDICAL DECISION MAKING:   Acute asthma exacerbation, acute bronchitis, pneumonia, upper respiratory infection, pulmonary embolism, bronchospasm, various cardiac etiologies verus numerous other etiologies versus combination of the above.      Abnormal lab results from this emergency department encounter:   Labs Reviewed - No data to display      Lab values for this patient within approximately the last 12 hours:   No results found for this or any previous visit (from the past 12 hour(s)).      Radiologist and cardiologist interpretations if available at time of this note:   No results found.      Medication(s) ordered for patient during this emergency visit encounter:     Medications       albuterol-ipratropium (DUO-NEB) 2.5 MG-0.5 MG/3 ML (3 mL Nebulization Given 06/21/20 1504)       albuterol (PROVENTIL VENTOLIN) nebulizer solution 2.5 mg (2.5 mg Nebulization Given 06/21/20 1557)           ED COURSE AND MEDICAL DECISION MAKING:  Patient's breathing improved and wheezing resolved in the emergency department with the noted albuterol  and atrovent HHN treatments.  Patient's initial steroid therapy may have been started in the ED as well.  Patient  is well and can be discharged home.        There are no other medical conditions that I believe are significantly contributing to the patient's shortness of breath except the noted acute asthma exacerbation and any noted triggers.      IMPRESSION AND MEDICAL DECISION MAKING:   Based upon the patient's presentation with noted HPI and PE, along with the work up done in the emergency department, I believe that the patient is having an acute asthma exacerbation in an asthmatic patient.      DIAGNOSIS:   1. Acute asthma exacerbation.      SPECIFIC PATIENT INSTRUCTIONS FROM THE PHYSICIAN WHO TREATED YOU IN THE ER TODAY:   1. Return if any concerns or worsening of condition(s)   2.  Use your albuterol inhaler, if prescribed, and/or home nebulizer machine (if you have one)  for wheezing.    4. FOLLOW UP APPOINTMENT:  Your primary doctor in 1-2 days.        Patient is improved, resting quietly and comfortably.  The patient will be discharged home.       The patient was reassured that these symptoms do not appear to represent a serious or life threatening condition at this time. Warning signs of worsening condition were discussed and understood by the patient.       Based on patient's age, coexisting illness, exam, and the results of this ED evaluation, the decision to treat as an outpatient was made. Based on the information available at time of discharge, acute pathology requiring immediate intervention was deemed  relative unlikely.       While it is impossible to completely exclude the possibility of underlying serious disease or worsening of condition, I feel the relative likelihood is extremely low. I discussed this uncertainty with the patient, who understood ED evaluation and treatment  and felt comfortable with the outpatient treatment plan.       All questions regarding care, test results, and follow up were answered. The patient is stable and appropriate to discharge. They understand that they should return to the emergency department for any new or worsening symptoms. I stressed the importance  of follow up for repeat assessment and possibly further evaluation/treatment.      Dictation disclaimer:  Please note that this dictation was completed with Dragon, the computer voice recognition software.  Quite often unanticipated grammatical, syntax, homophones, and other  interpretive errors are inadvertently transcribed by the computer software.  Please disregard these errors.  Please excuse any errors that have escaped final proofreading.         Coding Diagnos es        Clinical Impression:       1.  Mild intermittent asthma with acute exacerbation              Disposition        Disposition:  Home      Daisy Blossom, PA-C.

## 2020-08-26 ENCOUNTER — Inpatient Hospital Stay
Admit: 2020-08-26 | Discharge: 2020-08-26 | Disposition: A | Discharge status: Home / Self Care | Payer: PRIVATE HEALTH INSURANCE | Attending: Emergency Medicine

## 2020-08-26 DIAGNOSIS — J4521 Mild intermittent asthma with (acute) exacerbation: Secondary | ICD-10-CM

## 2020-08-26 MED ORDER — PREDNISONE 20 MG TAB
20 mg | ORAL | Status: AC
Start: 2020-08-26 — End: 2020-08-26
  Administered 2020-08-26: 10:00:00 via ORAL

## 2020-08-26 MED ORDER — IPRATROPIUM-ALBUTEROL 2.5 MG-0.5 MG/3 ML NEB SOLUTION
2.5 mg-0.5 mg/3 ml | RESPIRATORY_TRACT | Status: AC
Start: 2020-08-26 — End: 2020-08-26
  Administered 2020-08-26: 10:00:00 via RESPIRATORY_TRACT

## 2020-08-26 MED ORDER — PREDNISONE 20 MG TAB
20 mg | ORAL_TABLET | Freq: Every day | ORAL | 0 refills | Status: AC
Start: 2020-08-26 — End: 2020-08-31

## 2020-08-26 MED FILL — IPRATROPIUM-ALBUTEROL 2.5 MG-0.5 MG/3 ML NEB SOLUTION: 2.5 mg-0.5 mg/3 ml | RESPIRATORY_TRACT | Qty: 3

## 2020-08-26 MED FILL — PREDNISONE 20 MG TAB: 20 mg | ORAL | Qty: 3

## 2020-08-26 NOTE — ED Notes (Signed)
Discharge teaching provided to pt regarding treatment received, medications prescribed, and follow-up care. Pt verbalized understanding directions and follow up care. Pt left ambulatory with discharge paperwork in hand.

## 2020-08-26 NOTE — ED Provider Notes (Signed)
EMERGENCY DEPARTMENT HISTORY AND PHYSICAL EXAM      Date: 08/26/2020  Patient Name: Stephanie Murray    History of Presenting Illness     Chief Complaint   Patient presents with    Asthma       HPI:  Stephanie Murray is a 25 y.o. female with a history of asthma who presents with complaints of asthma attack, not responsive to home therapy    Patient is not taking controller medication. The patient has has been taking albuterol at home.  Last albuterol earlier.  This does  seem typical of prior asthma exacerbations.  The patient has not been intubated for asthma.  The patient has not required ICU admissions.    On review of systems, the patient denies fever, chills, rhinorrhea, chest pain, back pain, loss of consciousness, throat swelling, exposure to allergens.  Began few days ago, worsening today    PCP: None    Current Outpatient Medications   Medication Sig Dispense Refill    albuterol (PROVENTIL VENTOLIN) 2.5 mg /3 mL (0.083 %) nebu Take 2.5 mg by inhalation every four (4) hours as needed.      albuterol (PROVENTIL HFA, VENTOLIN HFA, PROAIR HFA) 90 mcg/actuation inhaler Take 1-2 Puffs by inhalation.      predniSONE (DELTASONE) 20 mg tablet Take 2 Tablets by mouth in the morning for 5 days. 10 Tablet 0    albuterol-ipratropium (DUO-NEB) 2.5 mg-0.5 mg/3 ml nebu 3 mL by Nebulization route every four (4) hours as needed for Wheezing.      fluticasone/umeclidin/vilanter (TRELEGY ELLIPTA IN) Take 2 Puffs by inhalation two (2) times a day. 200mcg      ipratropium (Atrovent HFA) 17 mcg/actuation inhaler Take 1 Puff by inhalation every six (6) hours as needed for Wheezing. 12.9 g 0       Past History     Past Medical History:  Past Medical History:   Diagnosis Date    Asthma        Past Surgical History:  History reviewed. No pertinent surgical history.    Family History:  History reviewed. No pertinent family history.    Social History:  Social History     Tobacco Use    Smoking status: Never    Smokeless  tobacco: Never   Substance Use Topics    Alcohol use: Not Currently    Drug use: Never       Allergies:  Allergies   Allergen Reactions    Sulfa (Sulfonamide Antibiotics) Hives       PMH, PSH, family history, social history, allergies reviewed with the patient with significant items noted above.  Review of Systems   As per HPI, otherwise reviewed and negative.     Physical Exam     Vitals:    08/26/20 0559 08/26/20 0630 08/26/20 0716 08/26/20 0718   BP: (!) 132/94      Pulse: (!) 104      Resp: 20      Temp: 97.3 ??F (36.3 ??C)      SpO2: 95% 94% 94% 96%   Weight: 87.1 kg (192 lb)      Height: 5\' 1"  (1.549 m)          Gen: Well-appearing, in no acute distress   HEENT: Normocephalic, sclera anicteric, soft tissues of the face and neck are normal, oropharynx clear.  Cardiovascular: Normal rate, regular rhythm, no murmurs, rubs, gallops.  Pulses intact and equal distally.  Pulmonary: In no respiratory distress.  Wheezing throughout  mild. No stridor.    ABD: Soft, nontender, nondistended.  Neuro: Alert.  Normal speech.  Normal mentation.  Psych: Normal thought content and thought processes.  GU: No CVA tenderness  EXT: Moves all extremities well.  No cyanosis or clubbing.  Skin: Warm and well-perfused.  No rash  Other    Diagnostic Study Results     Labs -   No results found for this or any previous visit (from the past 12 hour(s)).    Radiologic Studies -   No orders to display     CT Results  (Last 48 hours)      None          CXR Results  (Last 48 hours)      None              Medical Decision Making   I am the first provider for this patient.    I reviewed the vital signs, available nursing notes, past medical history, past surgical history, family history and social history.    Vital Signs-Reviewed the patient's vital signs.    EKG:  Interpreted by myself.  See interpretation in ED course.     Records Reviewed: Personally, on initial evaluation    MDM:   Patient presents with wheezing and dyspnea.  Exam significant  for wheezing and patient in no respiratory distress.   DDX considered: Asthma  DDX thought to be less likely but also considered due to high risk condition: Anaphylaxis, COPD, bronchitis    Patient condition on initial evaluation: well appearing      Plan:   Close monitoring  As per orders below:  Orders Placed This Encounter    albuterol-ipratropium (DUO-NEB) 2.5 MG-0.5 MG/3 ML    albuterol (PROVENTIL VENTOLIN) 2.5 mg /3 mL (0.083 %) nebu    albuterol (PROVENTIL HFA, VENTOLIN HFA, PROAIR HFA) 90 mcg/actuation inhaler    albuterol-ipratropium (DUO-NEB) 2.5 MG-0.5 MG/3 ML    predniSONE (DELTASONE) tablet 60 mg    predniSONE (DELTASONE) 20 mg tablet        This patient's history, signs, and symptoms are consistent with an asthma exacerbation/ acute bronchospasm.  The patient has no significant clinical features to suggests PNA, ACS, pulmonary embolism, or CHF.    Clinically not an infectious process such as pneumonia, no CP, based on history and exam very much doubt PE or cardiac issue.    Needs treatment for acute exacerbation of asthma.      Plan will be serial bronchodilators, steroids, and supportive care - with observation in ED for improvement.  Will give nebs, start steroids and reassess.       Patient feels much improved    Respiratory exam reveals:mild expiratory wheezing heard both lower lobes, Heart exam - S1, S2 normal, no murmur, no gallop, rate regular        ED Course:   ED Course as of 08/30/20 2044   Sun Aug 26, 2020   0715 Feeling much better at this time.    [DM]   [phone] No acute pathology necessitating further emergent workup or hospital admission is suspected or found.   Will discharge home with prednisone, follow up. She is comfortable with the plan and discharge at this time.     Expressed the importance of follow up for current symptoms and she agrees and was advised on what signs/symptoms to return immediately to the ER.      [DM]      ED Course User Index  [  DM] [place] Service, MD         Vitals Review/addressed -     Diagnostic Study Results     Orders Placed This Encounter    albuterol-ipratropium (DUO-NEB) 2.5 MG-0.5 MG/3 ML     Order Specific Question:   MODE OF DELIVERY     Answer:   Nebulizer     Order Specific Question:   Initiate RT Bronchodilator Protocol     Answer:   No    albuterol (PROVENTIL VENTOLIN) 2.5 mg /3 mL (0.083 %) nebu     Sig: Take 2.5 mg by inhalation every four (4) hours as needed.    albuterol (PROVENTIL HFA, VENTOLIN HFA, PROAIR HFA) 90 mcg/actuation inhaler     Sig: Take 1-2 Puffs by inhalation.    albuterol-ipratropium (DUO-NEB) 2.5 MG-0.5 MG/3 ML     Order Specific Question:   MODE OF DELIVERY     Answer:   Nebulizer     Order Specific Question:   Initiate RT Bronchodilator Protocol     Answer:   No    predniSONE (DELTASONE) tablet 60 mg    predniSONE (DELTASONE) 20 mg tablet     Sig: Take 2 Tablets by mouth in the morning for 5 days.     Dispense:  10 Tablet     Refill:  0       Labs -   No results found for this or any previous visit (from the past 12 hour(s)).    Radiologic Studies -   No orders to display     CT Results  (Last 48 hours)      None          CXR Results  (Last 48 hours)      None            Disposition     Disposition:  Home    CLINICAL IMPRESSION:    1. Mild intermittent asthma with acute exacerbation        It should be noted that I will be the provider of record for this patient  Boston Service, MD    Follow-up Information       Follow up With Specialties Details Why Contact Info    [facility] Emergency Medicine Go to  If symptoms worsen [address] [place] [address]  [phone]            Discharge Medication List as of 08/26/2020  7:16 AM        START taking these medications    Details   predniSONE (DELTASONE) 20 mg tablet Take 2 Tablets by mouth in the morning for 5 days., Normal, Disp-10 Tablet, R-0           CONTINUE these medications which have NOT CHANGED    Details   albuterol (PROVENTIL VENTOLIN) 2.5 mg  /3 mL (0.083 %) nebu Take 2.5 mg by inhalation every four (4) hours as needed., Historical Med      albuterol (PROVENTIL HFA, VENTOLIN HFA, PROAIR HFA) 90 mcg/actuation inhaler Take 1-2 Puffs by inhalation., Historical Med      albuterol-ipratropium (DUO-NEB) 2.5 mg-0.5 mg/3 ml nebu 3 mL by Nebulization route every four (4) hours as needed for Wheezing., Historical Med      fluticasone/umeclidin/vilanter (TRELEGY ELLIPTA IN) Take 2 Puffs by inhalation two (2) times a day. , Historical Med      ipratropium (Atrovent HFA) 17 mcg/actuation inhaler Take 1 Puff by inhalation every six (  6) hours as needed for Wheezing., Print, Disp-12.9 g, R-0             Please note that this dictation was completed with Dragon, the computer voice recognition software.  Quite often unanticipated grammatical, syntax, homophones, and other interpretive errors are inadvertently transcribed by the computer software.  Please disregard these errors.  Please excuse any errors that have escaped final proofreading.

## 2020-08-26 NOTE — ED Notes (Signed)
A&O female with hx of asthma. Increasing wheezing and SOB that began yesterday. Yesterday had good relief with home nebulizer, but not today. Denies chest pain.

## 2020-08-26 NOTE — ED Notes (Signed)
Bedside shift change report given to Zettie Cooley, RN by Veatrice Bourbon. Report included the following information SBAR, ED Summary, and MAR.

## 2020-08-26 NOTE — ED Notes (Signed)
Pt sleeping in bed with lights on. Pt 95% on room air, no apparent distress noted at this time. Bed low and locked, call bell within reach, will continue to monitor pt closely.

## 2021-01-03 ENCOUNTER — Inpatient Hospital Stay
Admit: 2021-01-03 | Discharge: 2021-01-03 | Disposition: A | Discharge status: Home / Self Care | Payer: PRIVATE HEALTH INSURANCE | Attending: Emergency Medicine

## 2021-01-03 DIAGNOSIS — J4520 Mild intermittent asthma, uncomplicated: Secondary | ICD-10-CM

## 2021-01-03 MED ORDER — PREDNISONE 20 MG TAB
20 mg | ORAL_TABLET | Freq: Every day | ORAL | 0 refills | Status: DC
Start: 2021-01-03 — End: 2021-01-03

## 2021-01-03 MED ORDER — ALBUTEROL SULFATE 0.083 % (0.83 MG/ML) SOLN FOR INHALATION
2.5 mg /3 mL (0.083 %) | RESPIRATORY_TRACT | Status: AC
Start: 2021-01-03 — End: 2021-01-03
  Administered 2021-01-03: 06:00:00 via RESPIRATORY_TRACT

## 2021-01-03 MED ORDER — PREDNISONE 20 MG TAB
20 mg | ORAL | Status: AC
Start: 2021-01-03 — End: 2021-01-03
  Administered 2021-01-03: 06:00:00 via ORAL

## 2021-01-03 MED ORDER — PREDNISONE 20 MG TAB
20 mg | ORAL_TABLET | Freq: Every day | ORAL | 0 refills | Status: AC
Start: 2021-01-03 — End: 2021-01-05

## 2021-01-03 MED ORDER — ALBUTEROL SULFATE HFA 90 MCG/ACTUATION AEROSOL INHALER
90 mcg/actuation | RESPIRATORY_TRACT | 0 refills | Status: AC | PRN
Start: 2021-01-03 — End: 2021-02-13

## 2021-01-03 MED ORDER — IPRATROPIUM-ALBUTEROL 2.5 MG-0.5 MG/3 ML NEB SOLUTION
2.5 mg-0.5 mg/3 ml | RESPIRATORY_TRACT | Status: AC
Start: 2021-01-03 — End: 2021-01-03
  Administered 2021-01-03: 06:00:00 via RESPIRATORY_TRACT

## 2021-01-03 MED FILL — PREDNISONE 10 MG TAB: 10 mg | ORAL | Qty: 1

## 2021-01-03 MED FILL — IPRATROPIUM-ALBUTEROL 2.5 MG-0.5 MG/3 ML NEB SOLUTION: 2.5 mg-0.5 mg/3 ml | RESPIRATORY_TRACT | Qty: 3

## 2021-01-03 MED FILL — ALBUTEROL SULFATE 0.083 % (0.83 MG/ML) SOLN FOR INHALATION: 2.5 mg /3 mL (0.083 %) | RESPIRATORY_TRACT | Qty: 1

## 2021-01-03 NOTE — ED Provider Notes (Signed)
ED Provider Notes by Sherren Kerns, MD at 01/03/21 0255                Author: Sherren Kerns, MD  Service: Emergency Medicine  Author Type: Physician       Filed: 01/07/21 0908  Date of Service: 01/03/21 0255  Status: Signed          Editor: Sherren Kerns, MD (Physician)               EMERGENCY DEPARTMENT HISTORY AND PHYSICAL EXAM           Date: 01/03/2021   Patient Name: Stephanie Murray           History of Presenting Illness          Chief Complaint       Patient presents with        ?  Wheezing           Location/Duration/Severity/Modifying factors      Chief Complaint       Patient presents with        ?  Wheezing           HPI:  Stephanie Murray is a 25 y.o. female with history of asthma presents with a concern of wheezing and being SOB.  Symptoms  started today.      Location: chest   Duration: 1 day    Quality:    Severity: mild to moderate   Modifying Factors: none      Additional History: none      There are no other complaints, changes, or physical findings at this time.      PCP: None        Current Outpatient Medications          Medication  Sig  Dispense  Refill           ?  albuterol (PROVENTIL VENTOLIN) 2.5 mg /3 mL (0.083 %) nebu  Take 2.5 mg by inhalation every four (4) hours as needed.         ?  albuterol (PROVENTIL HFA, VENTOLIN HFA, PROAIR HFA) 90 mcg/actuation inhaler  Take 1-2 Puffs by inhalation.               ?  albuterol-ipratropium (DUO-NEB) 2.5 mg-0.5 mg/3 ml nebu  3 mL by Nebulization route every four (4) hours as needed for Wheezing.               ?  fluticasone/umeclidin/vilanter (TRELEGY ELLIPTA IN)  Take 2 Puffs by inhalation two (2) times a day.               ?  ipratropium (Atrovent HFA) 17 mcg/actuation inhaler  Take 1 Puff by inhalation every six (6) hours as needed for Wheezing.  12.9 g  0             Past History        Past Medical History:     Past Medical History:        Diagnosis  Date         ?  Asthma             Past Surgical  History:   No past surgical history on file.      Family History:   No family history on file.      Social History:     Social History          Tobacco Use         ?  Smoking status:  Never     ?  Smokeless tobacco:  Never       Substance Use Topics         ?  Alcohol use:  Not Currently         ?  Drug use:  Never           Allergies:     Allergies        Allergen  Reactions         ?  Sulfa (Sulfonamide Antibiotics)  Hives                Review of Systems        Review of Systems    Constitutional: Negative.     HENT: Negative.      Eyes: Negative.     Respiratory:  Positive for wheezing.     Cardiovascular: Negative.     Gastrointestinal: Negative.     Endocrine: Negative.     Genitourinary: Negative.     Musculoskeletal: Negative.     Skin: Negative.     Allergic/Immunologic: Negative.     Neurological: Negative.     Hematological: Negative.     Psychiatric/Behavioral: Negative.      All other systems reviewed and are negative.        Physical Exam        Physical Exam   Vitals and nursing note reviewed.    Constitutional:        General: She is not in acute distress.      Appearance: She is well-developed. She is not diaphoretic.    HENT:       Head: Normocephalic.       Right Ear: External ear normal.       Left Ear: External ear normal.       Mouth/Throat:       Pharynx: No oropharyngeal exudate.    Eyes:       General: No scleral icterus.         Right eye: No discharge.          Left eye: No discharge.       Conjunctiva/sclera: Conjunctivae normal.       Pupils: Pupils are equal, round, and reactive to light.    Neck:       Thyroid: No thyromegaly.       Vascular: No JVD.       Trachea: No tracheal deviation.    Cardiovascular:       Rate and Rhythm: Normal rate and regular rhythm.       Heart sounds: Normal heart sounds. No murmur heard.     No friction rub. No gallop.    Pulmonary:       Effort: Pulmonary effort is normal. No respiratory distress.       Breath sounds: Wheezing present.    Chest:        Chest wall: No tenderness.    Abdominal:       General: Bowel sounds are normal. There is no distension.       Palpations: Abdomen is soft. There is no mass.       Tenderness: There is no abdominal tenderness. There is no guarding or rebound.     Musculoskeletal:          General: No tenderness. Normal range of motion.       Cervical back: Normal range of motion and neck supple.     Lymphadenopathy:  Cervical: No cervical adenopathy.    Skin:      General: Skin is warm and dry.       Coloration: Skin is not pale.       Findings: No erythema or rash.    Neurological:       Mental Status: She is alert and oriented to person, place, and time.       Cranial Nerves: No cranial nerve deficit.       Motor: No abnormal muscle tone.       Coordination: Coordination normal.       Deep Tendon Reflexes: Reflexes normal.            Lab and Diagnostic Study Results        Labs -   No results found for this or any previous visit (from the past 24 hour(s)).         Radiologic Studies -      No orders to display                Procedures and Critical Care           Performed by: Sherren Kerns, MD      Procedures            Medical Decision Making and ED Course     - I am the first and primary provider for this patient AND AM THE PRIMARY PROVIDER OF RECORD.      - I reviewed the vital signs, available nursing notes, past medical history, past surgical history, family history and social history.      - Initial assessment performed. The patients presenting problems have been discussed, and the staff are in agreement with the care plan formulated and outlined with them.  I have encouraged them to ask questions as they arise throughout their visit.      Vital Signs-Reviewed the patient's vital signs.      Patient Vitals for the past 12 hrs:            Temp  Pulse  Resp  BP  SpO2            01/03/21 0050  --  --  --  --  91 %            01/03/21 0045  98 ??F (36.7 ??C)  (!) 103  26  125/83  92 %              Provider Notes (Medical  Decision Making): asthma, bronchospasm, URI, allergies      MDM          ED Course:       Patient treated with an albuterol nebulization tx.  Symptoms resolved.       ------------------------------------------------------------------------------------------------------------              Consultations:           Consultations: none           Disposition        D/C home.  F/U PCP in 2 days.  Return to ER prn.   Rx: albuterol inhaler.          Diagnosis        Clinical Impression: asthma      Attestations:      Sherren Kerns, MD

## 2021-01-03 NOTE — ED Notes (Signed)
Patient states that she feels better after treatments. Patient sitting on stretcher in position of comfort.

## 2021-01-03 NOTE — ED Notes (Signed)
Discharge instructions reviewed with patient.  Patient verbalized understanding. Patient ambulated from ED treatment area without incident.

## 2021-01-03 NOTE — ED Notes (Signed)
Patient with asthma flare up that started today after a lot of activity.  Patient did not have her inhaler or albuterol treatment today.

## 2021-02-10 ENCOUNTER — Inpatient Hospital Stay
Admit: 2021-02-10 | Discharge: 2021-02-14 | Disposition: A | Discharge status: Home / Self Care | Payer: PRIVATE HEALTH INSURANCE | Attending: Internal Medicine | Admitting: Internal Medicine

## 2021-02-10 ENCOUNTER — Emergency Department: Admit: 2021-02-10 | Payer: PRIVATE HEALTH INSURANCE | Primary: Family

## 2021-02-10 ENCOUNTER — Inpatient Hospital Stay: Admit: 2021-02-10 | Payer: PRIVATE HEALTH INSURANCE | Primary: Family

## 2021-02-10 DIAGNOSIS — J4551 Severe persistent asthma with (acute) exacerbation: Secondary | ICD-10-CM

## 2021-02-10 DIAGNOSIS — J4541 Moderate persistent asthma with (acute) exacerbation: Secondary | ICD-10-CM

## 2021-02-10 LAB — CBC WITH AUTO DIFFERENTIAL
Basophils %: 0 % (ref 0–2)
Basophils Absolute: 0 10*3/uL (ref 0.0–0.1)
Eosinophils %: 4 % (ref 0–5)
Eosinophils Absolute: 0.3 10*3/uL (ref 0.0–0.4)
Granulocyte Absolute Count: 0 10*3/uL (ref 0.00–0.04)
Hematocrit: 36.4 % (ref 35.0–45.0)
Hemoglobin: 11.5 g/dL — ABNORMAL LOW (ref 12.0–16.0)
Immature Granulocytes: 0 % (ref 0.0–0.5)
Lymphocytes %: 25 % (ref 21–52)
Lymphocytes Absolute: 1.9 10*3/uL (ref 0.9–3.6)
MCH: 24 PG (ref 24.0–34.0)
MCHC: 31.6 g/dL (ref 31.0–37.0)
MCV: 75.8 FL — ABNORMAL LOW (ref 78.0–100.0)
MPV: 9.2 FL (ref 9.2–11.8)
Monocytes %: 4 % (ref 3–10)
Monocytes Absolute: 0.3 10*3/uL (ref 0.05–1.2)
NRBC Absolute: 0 10*3/uL (ref 0.00–0.01)
Neutrophils %: 66 % (ref 40–73)
Neutrophils Absolute: 5 10*3/uL (ref 1.8–8.0)
Nucleated RBCs: 0 PER 100 WBC
Platelets: 394 10*3/uL (ref 135–420)
RBC: 4.8 M/uL (ref 4.20–5.30)
RDW: 17.8 % — ABNORMAL HIGH (ref 11.6–14.5)
WBC: 7.5 10*3/uL (ref 4.6–13.2)

## 2021-02-10 LAB — COMPREHENSIVE METABOLIC PANEL
ALT: 19 U/L (ref 13–56)
AST: 14 U/L (ref 10–38)
Albumin/Globulin Ratio: 0.9 (ref 0.8–1.7)
Albumin: 3.6 g/dL (ref 3.4–5.0)
Alkaline Phosphatase: 99 U/L (ref 45–117)
Anion Gap: 7 mmol/L (ref 3.0–18)
BUN: 9 MG/DL (ref 7.0–18)
Bun/Cre Ratio: 11 — ABNORMAL LOW (ref 12–20)
CO2: 25 mmol/L (ref 21–32)
Calcium: 8.9 MG/DL (ref 8.5–10.1)
Chloride: 108 mmol/L (ref 100–111)
Creatinine: 0.82 MG/DL (ref 0.6–1.3)
ESTIMATED GLOMERULAR FILTRATION RATE: >60 mL/min/{1.73_m2} (ref >60)
Globulin: 3.8 g/dL (ref 2.0–4.0)
Glucose: 80 mg/dL (ref 74–99)
Potassium: 3.7 mmol/L (ref 3.5–5.5)
Sodium: 140 mmol/L (ref 136–145)
Total Bilirubin: 0.2 MG/DL (ref 0.2–1.0)
Total Protein: 7.4 g/dL (ref 6.4–8.2)

## 2021-02-10 LAB — RESPIRATORY VIRUS PANEL W/COVID-19, PCR
Adenovirus: NOT DETECTED
Adenovirus: NOT DETECTED
B. parapertussis, PCR: NOT DETECTED
BORDETELLA PARAPERTUSSIS PCR, BORPAR: NOT DETECTED
Bordetella pertussis - PCR: NOT DETECTED
Bordetella pertussis by PCR: NOT DETECTED
CORONAVIRUS 229E: NOT DETECTED
CORONAVIRUS HKU1: NOT DETECTED
CORONAVIRUS NL63: NOT DETECTED
CORONAVIRUS OC43: NOT DETECTED
Chlamydophila pneumoniae DNA, QL, PCR: NOT DETECTED
Chlamydophilia pneumoniae by PCR: NOT DETECTED
Coronavirus 229E: NOT DETECTED
Coronavirus CVNL63: NOT DETECTED
Coronavirus HKU1: NOT DETECTED
Coronavirus OC43: NOT DETECTED
INFLUENZA A: NOT DETECTED
INFLUENZA B: NOT DETECTED
Influenza A: NOT DETECTED
Influenza B: NOT DETECTED
Metapneumovirus: NOT DETECTED
Metapneumovirus: NOT DETECTED
Mycoplasma pneumoniae DNA, QL, PCR: NOT DETECTED
Mycoplasma pneumoniae by PCR: NOT DETECTED
PARAINFLUENZA 4: NOT DETECTED
Parainfluenza 1: NOT DETECTED
Parainfluenza 1: NOT DETECTED
Parainfluenza 2: NOT DETECTED
Parainfluenza 2: NOT DETECTED
Parainfluenza 3: NOT DETECTED
Parainfluenza 3: NOT DETECTED
Parainfluenza virus 4: NOT DETECTED
RSV by PCR: NOT DETECTED
RSV by PCR: NOT DETECTED
Rhinovirus Enterovirus PCR: DETECTED — AB
Rhinovirus and Enterovirus: DETECTED — AB
SARS Coronavirus-2: NOT DETECTED
SARS-CoV-2, PCR: NOT DETECTED

## 2021-02-10 LAB — HCG QL SERUM
HCG, Ql.: NEGATIVE
HCG, Ql.: NEGATIVE

## 2021-02-10 LAB — PROCALCITONIN
Procalcitonin: <0.05 ng/mL
Procalcitonin: <0.05 ng/mL

## 2021-02-10 LAB — RAPID INFLUENZA A/B ANTIGENS
Flu A Antigen: NEGATIVE
Influenza B Antigen: NEGATIVE

## 2021-02-10 LAB — D-DIMER, QUANTITATIVE: D-Dimer, Quant: 0.42 ug/ml(FEU) (ref <0.46)

## 2021-02-10 LAB — PROBNP, N-TERMINAL: BNP: 6 PG/ML (ref 0–450)

## 2021-02-10 LAB — COVID-19, RAPID: SARS-CoV-2, Rapid: NOT DETECTED

## 2021-02-10 LAB — TROPONIN, HIGH SENSITIVITY: Troponin, High Sensitivity: <3 ng/L (ref 0–54)

## 2021-02-10 LAB — CBC WITH AUTOMATED DIFF
ABS. BASOPHILS: 0 10*3/uL (ref 0.0–0.1)
ABS. EOSINOPHILS: 0.3 10*3/uL (ref 0.0–0.4)
ABS. IMM. GRANS.: 0 10*3/uL (ref 0.00–0.04)
ABS. LYMPHOCYTES: 1.9 10*3/uL (ref 0.9–3.6)
ABS. MONOCYTES: 0.3 10*3/uL (ref 0.05–1.2)
ABS. NEUTROPHILS: 5 10*3/uL (ref 1.8–8.0)
ABSOLUTE NRBC: 0 10*3/uL (ref 0.00–0.01)
BASOPHILS: 0 % (ref 0–2)
EOSINOPHILS: 4 % (ref 0–5)
HCT: 36.4 % (ref 35.0–45.0)
HGB: 11.5 g/dL — ABNORMAL LOW (ref 12.0–16.0)
IMMATURE GRANULOCYTES: 0 % (ref 0.0–0.5)
LYMPHOCYTES: 25 % (ref 21–52)
MCH: 24 pg (ref 24.0–34.0)
MCHC: 31.6 g/dL (ref 31.0–37.0)
MCV: 75.8 FL — ABNORMAL LOW (ref 78.0–100.0)
MONOCYTES: 4 % (ref 3–10)
MPV: 9.2 fL (ref 9.2–11.8)
NEUTROPHILS: 66 % (ref 40–73)
NRBC: 0 /100{WBCs}
PLATELET: 394 10*3/uL (ref 135–420)
RBC: 4.8 M/uL (ref 4.20–5.30)
RDW: 17.8 % — ABNORMAL HIGH (ref 11.6–14.5)
WBC: 7.5 10*3/uL (ref 4.6–13.2)

## 2021-02-10 LAB — METABOLIC PANEL, COMPREHENSIVE
A-G Ratio: 0.9 (ref 0.8–1.7)
ALT (SGPT): 19 U/L (ref 13–56)
AST (SGOT): 14 U/L (ref 10–38)
Albumin: 3.6 g/dL (ref 3.4–5.0)
Alk. phosphatase: 99 U/L (ref 45–117)
Anion gap: 7 mmol/L (ref 3.0–18)
BUN/Creatinine ratio: 11 — ABNORMAL LOW (ref 12–20)
BUN: 9 mg/dL (ref 7.0–18)
Bilirubin, total: 0.2 mg/dL (ref 0.2–1.0)
CO2: 25 mmol/L (ref 21–32)
Calcium: 8.9 mg/dL (ref 8.5–10.1)
Chloride: 108 mmol/L (ref 100–111)
Creatinine: 0.82 MG/DL (ref 0.6–1.3)
Globulin: 3.8 g/dL (ref 2.0–4.0)
Glucose: 80 mg/dL (ref 74–99)
Potassium: 3.7 mmol/L (ref 3.5–5.5)
Protein, total: 7.4 g/dL (ref 6.4–8.2)
Sodium: 140 mmol/L (ref 136–145)
eGFR: >60 mL/min/{1.73_m2} (ref >60)

## 2021-02-10 LAB — D DIMER: D DIMER: 0.42 ug{FEU}/mL (ref <0.46)

## 2021-02-10 LAB — COVID-19 RAPID TEST: COVID-19 rapid test: NOT DETECTED

## 2021-02-10 LAB — INFLUENZA A & B AG (RAPID TEST)
Influenza A Antigen: NEGATIVE
Influenza B Antigen: NEGATIVE

## 2021-02-10 LAB — TROPONIN-HIGH SENSITIVITY: Troponin-High Sensitivity: <3 ng/L (ref 0–54)

## 2021-02-10 LAB — NT-PRO BNP: NT pro-BNP: 6 PG/ML (ref 0–450)

## 2021-02-10 MED ORDER — ALBUTEROL SULFATE 0.083 % (0.83 MG/ML) SOLN FOR INHALATION
2.5 mg /3 mL (0.083 %) | RESPIRATORY_TRACT | Status: DC | PRN
Start: 2021-02-10 — End: 2021-02-10

## 2021-02-10 MED ORDER — ARFORMOTEROL 15 MCG/2 ML NEB SOLUTION
15 mcg/2 mL | Freq: Two times a day (BID) | RESPIRATORY_TRACT | Status: DC
Start: 2021-02-10 — End: 2021-02-11
  Administered 2021-02-11 (×2): via RESPIRATORY_TRACT

## 2021-02-10 MED ORDER — ALBUTEROL SULFATE 0.083 % (0.83 MG/ML) SOLN FOR INHALATION
2.5 mg /3 mL (0.083 %) | RESPIRATORY_TRACT | Status: AC
Start: 2021-02-10 — End: 2021-02-10
  Administered 2021-02-10: 19:00:00 via RESPIRATORY_TRACT

## 2021-02-10 MED ORDER — CEFTRIAXONE 1 GRAM SOLUTION FOR INJECTION
1 gram | INTRAMUSCULAR | Status: DC
Start: 2021-02-10 — End: 2021-02-14
  Administered 2021-02-10 – 2021-02-14 (×4): via INTRAVENOUS

## 2021-02-10 MED ORDER — BUDESONIDE 0.5 MG/2 ML NEB SUSPENSION
0.5 mg/2 mL | Freq: Two times a day (BID) | RESPIRATORY_TRACT | Status: DC
Start: 2021-02-10 — End: 2021-02-11
  Administered 2021-02-11 (×2): via RESPIRATORY_TRACT

## 2021-02-10 MED ORDER — MAGNESIUM SULFATE 2 GRAM/50 ML IVPB
2 gram/50 mL (4 %) | Freq: Once | INTRAVENOUS | Status: AC
Start: 2021-02-10 — End: 2021-02-10
  Administered 2021-02-10: 18:00:00 via INTRAVENOUS

## 2021-02-10 MED ORDER — METHYLPREDNISOLONE (PF) 40 MG/ML IJ SOLR
40 mg/mL | Freq: Three times a day (TID) | INTRAMUSCULAR | Status: DC
Start: 2021-02-10 — End: 2021-02-10
  Administered 2021-02-10: 20:00:00 via INTRAVENOUS

## 2021-02-10 MED ORDER — ALBUTEROL SULFATE 0.083 % (0.83 MG/ML) SOLN FOR INHALATION
2.530.083 mg /3 mL (0.083 %) | RESPIRATORY_TRACT | Status: AC
Start: 2021-02-10 — End: 2021-02-11
  Administered 2021-02-11 (×6): via RESPIRATORY_TRACT

## 2021-02-10 MED ORDER — ONDANSETRON (PF) 4 MG/2 ML INJECTION
4 mg/2 mL | INTRAMUSCULAR | Status: AC
Start: 2021-02-10 — End: 2021-02-10
  Administered 2021-02-10: 23:00:00 via INTRAVENOUS

## 2021-02-10 MED ORDER — FAMOTIDINE 20 MG TAB
20 mg | Freq: Two times a day (BID) | ORAL | Status: DC
Start: 2021-02-10 — End: 2021-02-12
  Administered 2021-02-11 (×2): via ORAL

## 2021-02-10 MED ORDER — MONTELUKAST 10 MG TAB
10 mg | ORAL | Status: AC
Start: 2021-02-10 — End: 2021-02-10
  Administered 2021-02-10: 20:00:00 via ORAL

## 2021-02-10 MED ORDER — METHYLPREDNISOLONE (PF) 125 MG/2 ML IJ SOLR
125 mg/2 mL | INTRAMUSCULAR | Status: AC
Start: 2021-02-10 — End: 2021-02-10
  Administered 2021-02-10: 18:00:00 via INTRAVENOUS

## 2021-02-10 MED ORDER — IPRATROPIUM-ALBUTEROL 2.5 MG-0.5 MG/3 ML NEB SOLUTION
2.5 mg-0.5 mg/3 ml | RESPIRATORY_TRACT | Status: AC
Start: 2021-02-10 — End: 2021-02-10
  Administered 2021-02-10: 16:00:00 via RESPIRATORY_TRACT

## 2021-02-10 MED ORDER — IPRATROPIUM-ALBUTEROL 2.5 MG-0.5 MG/3 ML NEB SOLUTION
2.5 mg-0.5 mg/3 ml | RESPIRATORY_TRACT | Status: DC | PRN
Start: 2021-02-10 — End: 2021-02-10
  Administered 2021-02-10: 19:00:00 via RESPIRATORY_TRACT

## 2021-02-10 MED ORDER — SODIUM CHLORIDE 0.9% BOLUS IV
0.9 % | Freq: Once | INTRAVENOUS | Status: AC
Start: 2021-02-10 — End: 2021-02-10
  Administered 2021-02-10: 18:00:00 via INTRAVENOUS

## 2021-02-10 MED ORDER — METHYLPREDNISOLONE (PF) 40 MG/ML IJ SOLR
40 mg/mL | Freq: Three times a day (TID) | INTRAMUSCULAR | Status: DC
Start: 2021-02-10 — End: 2021-02-11
  Administered 2021-02-11 (×3): via INTRAVENOUS

## 2021-02-10 MED ORDER — METHYLPREDNISOLONE (PF) 40 MG/ML IJ SOLR
40 mg/mL | INTRAMUSCULAR | Status: AC
Start: 2021-02-10 — End: 2021-02-10
  Administered 2021-02-10: 22:00:00 via INTRAVENOUS

## 2021-02-10 MED ORDER — SODIUM CHLORIDE 0.9 % IV
500 mg | INTRAVENOUS | Status: DC
Start: 2021-02-10 — End: 2021-02-14
  Administered 2021-02-10 – 2021-02-14 (×4): via INTRAVENOUS

## 2021-02-10 MED FILL — FAMOTIDINE 20 MG TAB: 20 mg | ORAL | Qty: 1

## 2021-02-10 MED FILL — MAGNESIUM SULFATE 2 GRAM/50 ML IVPB: 2 gram/50 mL (4 %) | INTRAVENOUS | Qty: 50

## 2021-02-10 MED FILL — ONDANSETRON (PF) 4 MG/2 ML INJECTION: 4 mg/2 mL | INTRAMUSCULAR | Qty: 2

## 2021-02-10 MED FILL — ALBUTEROL SULFATE 0.083 % (0.83 MG/ML) SOLN FOR INHALATION: 2.5 mg /3 mL (0.083 %) | RESPIRATORY_TRACT | Qty: 1

## 2021-02-10 MED FILL — SOLU-MEDROL (PF) 40 MG/ML SOLUTION FOR INJECTION: 40 mg/mL | INTRAMUSCULAR | Qty: 1

## 2021-02-10 MED FILL — AZITHROMYCIN 500 MG IV SOLUTION: 500 mg | INTRAVENOUS | Qty: 5

## 2021-02-10 MED FILL — SOLU-MEDROL (PF) 125 MG/2 ML SOLUTION FOR INJECTION: 125 mg/2 mL | INTRAMUSCULAR | Qty: 2

## 2021-02-10 MED FILL — BUDESONIDE 0.5 MG/2 ML NEB SUSPENSION: 0.5 mg/2 mL | RESPIRATORY_TRACT | Qty: 1

## 2021-02-10 MED FILL — CEFTRIAXONE 1 GRAM SOLUTION FOR INJECTION: 1 gram | INTRAMUSCULAR | Qty: 1

## 2021-02-10 MED FILL — MONTELUKAST 10 MG TAB: 10 mg | ORAL | Qty: 1

## 2021-02-10 MED FILL — SODIUM CHLORIDE 0.9 % IV: INTRAVENOUS | Qty: 1000

## 2021-02-10 MED FILL — BROVANA 15 MCG/2 ML SOLUTION FOR NEBULIZATION: 15 mcg/2 mL | RESPIRATORY_TRACT | Qty: 2

## 2021-02-10 MED FILL — IPRATROPIUM-ALBUTEROL 2.5 MG-0.5 MG/3 ML NEB SOLUTION: 2.5 mg-0.5 mg/3 ml | RESPIRATORY_TRACT | Qty: 3

## 2021-02-10 NOTE — ED Notes (Signed)
Patient resting on stretcher, NAD noted, call bell in reach, bed low and locked with side rails up x 2; Reports pain 0/10. Updated on plan of care, patient verbalizes understanding. This nurse will continue to monitor.    Pt was transfer to a hospital bed and given an adult diet tray.

## 2021-02-10 NOTE — ED Provider Notes (Signed)
The patient is a 26 year old woman with past medical history significant for asthma, who presents to the ED today with shortness of breath.  She states that her symptoms began yesterday.  This morning she began using her nebulizer machine at 6 AM.  She has been using it every hour since then.  At 1030 this morning she decided that she did need to go to the hospital.    She denies any fevers, chills, nausea or vomiting.    She states that she is no longer pregnant and she delivered her baby on August 08, 2020.       Past Medical History:   Diagnosis Date    Asthma        No past surgical history on file.      No family history on file.    Social History     Socioeconomic History    Marital status: SINGLE     Spouse name: Not on file    Number of children: Not on file    Years of education: Not on file    Highest education level: Not on file   Occupational History    Not on file   Tobacco Use    Smoking status: Never    Smokeless tobacco: Never   Substance and Sexual Activity    Alcohol use: Not Currently    Drug use: Never    Sexual activity: Not on file   Other Topics Concern    Not on file   Social History Narrative    Not on file     Social Determinants of Health     Financial Resource Strain: Not on file   Food Insecurity: Not on file   Transportation Needs: Not on file   Physical Activity: Not on file   Stress: Not on file   Social Connections: Not on file   Intimate Partner Violence: Not on file   Housing Stability: Not on file         ALLERGIES: Sulfa (sulfonamide antibiotics)    Review of Systems   All other systems reviewed and are negative.    Vitals:    02/10/21 1114 02/10/21 1135 02/10/21 1200 02/10/21 1300   BP: 123/77  127/72 (!) 139/56   Pulse: (!) 110  (!) 119 (!) 125   Resp: 26  18 19    Temp: 99.2 ??F (37.3 ??C)      SpO2: 98% 98% 92% 96%            Physical Exam  Vitals and nursing note reviewed.   Constitutional:       Appearance: Normal appearance.   HENT:      Head: Normocephalic and atraumatic.       Right Ear: External ear normal.      Left Ear: External ear normal.      Nose: Nose normal.      Mouth/Throat:      Mouth: Mucous membranes are moist.      Pharynx: Oropharynx is clear.   Eyes:      Extraocular Movements: Extraocular movements intact.      Conjunctiva/sclera: Conjunctivae normal.      Pupils: Pupils are equal, round, and reactive to light.   Cardiovascular:      Rate and Rhythm: Regular rhythm. Tachycardia present.      Pulses: Normal pulses.      Heart sounds: Normal heart sounds.   Pulmonary:      Effort: Respiratory distress present.  Breath sounds: Wheezing present.   Abdominal:      General: Abdomen is flat. Bowel sounds are normal.      Palpations: Abdomen is soft.   Musculoskeletal:         General: Normal range of motion.      Cervical back: Normal range of motion and neck supple.   Skin:     General: Skin is warm and dry.      Capillary Refill: Capillary refill takes less than 2 seconds.   Neurological:      General: No focal deficit present.      Mental Status: She is alert and oriented to person, place, and time.   Psychiatric:         Mood and Affect: Mood normal.         Behavior: Behavior normal.         Thought Content: Thought content normal.         Judgment: Judgment normal.      Recent Results (from the past 12 hour(s))   INFLUENZA A & B AG (RAPID TEST)    Collection Time: 02/10/21 12:15 PM   Result Value Ref Range    Influenza A Antigen Negative NEG      Influenza B Antigen Negative NEG     COVID-19 RAPID TEST    Collection Time: 02/10/21 12:15 PM   Result Value Ref Range    Specimen source Nasopharyngeal      COVID-19 rapid test Not detected     CBC WITH AUTOMATED DIFF    Collection Time: 02/10/21  1:30 PM   Result Value Ref Range    WBC 7.5 4.6 - 13.2 K/uL    RBC 4.80 4.20 - 5.30 M/uL    HGB 11.5 (L) 12.0 - 16.0 g/dL    HCT 36.4 35.0 - 45.0 %    MCV 75.8 (L) 78.0 - 100.0 FL    MCH 24.0 24.0 - 34.0 PG    MCHC 31.6 31.0 - 37.0 g/dL    RDW 17.8 (H) 11.6 - 14.5 %    PLATELET  394 135 - 420 K/uL    MPV 9.2 9.2 - 11.8 FL    NRBC 0.0 0 PER 100 WBC    ABSOLUTE NRBC 0.00 0.00 - 0.01 K/uL    NEUTROPHILS 66 40 - 73 %    LYMPHOCYTES 25 21 - 52 %    MONOCYTES 4 3 - 10 %    EOSINOPHILS 4 0 - 5 %    BASOPHILS 0 0 - 2 %    IMMATURE GRANULOCYTES 0 0.0 - 0.5 %    ABS. NEUTROPHILS 5.0 1.8 - 8.0 K/UL    ABS. LYMPHOCYTES 1.9 0.9 - 3.6 K/UL    ABS. MONOCYTES 0.3 0.05 - 1.2 K/UL    ABS. EOSINOPHILS 0.3 0.0 - 0.4 K/UL    ABS. BASOPHILS 0.0 0.0 - 0.1 K/UL    ABS. IMM. GRANS. 0.0 0.00 - 0.04 K/UL    DF AUTOMATED     METABOLIC PANEL, COMPREHENSIVE    Collection Time: 02/10/21  1:30 PM   Result Value Ref Range    Sodium 140 136 - 145 mmol/L    Potassium 3.7 3.5 - 5.5 mmol/L    Chloride 108 100 - 111 mmol/L    CO2 25 21 - 32 mmol/L    Anion gap 7 3.0 - 18 mmol/L    Glucose 80 74 - 99 mg/dL    BUN 9 7.0 - 18 MG/DL  Creatinine 0.82 0.6 - 1.3 MG/DL    BUN/Creatinine ratio 11 (L) 12 - 20      eGFR >60 >60 ml/min/1.63m    Calcium 8.9 8.5 - 10.1 MG/DL    Bilirubin, total 0.2 0.2 - 1.0 MG/DL    ALT (SGPT) 19 13 - 56 U/L    AST (SGOT) 14 10 - 38 U/L    Alk. phosphatase 99 45 - 117 U/L    Protein, total 7.4 6.4 - 8.2 g/dL    Albumin 3.6 3.4 - 5.0 g/dL    Globulin 3.8 2.0 - 4.0 g/dL    A-G Ratio 0.9 0.8 - 1.7     NT-PRO BNP    Collection Time: 02/10/21  1:30 PM   Result Value Ref Range    NT pro-BNP 6 0 - 450 PG/ML   TROPONIN-HIGH SENSITIVITY    Collection Time: 02/10/21  1:30 PM   Result Value Ref Range    Troponin-High Sensitivity <3 0 - 54 ng/L   D DIMER    Collection Time: 02/10/21  1:30 PM   Result Value Ref Range    D DIMER 0.42 <0.46 ug/ml(FEU)   HCG QL SERUM    Collection Time: 02/10/21  1:30 PM   Result Value Ref Range    HCG, Ql. Negative NEG     PROCALCITONIN    Collection Time: 02/10/21  1:30 PM   Result Value Ref Range    Procalcitonin <0.05 ng/mL   RESPIRATORY VIRUS PANEL W/COVID-19, PCR    Collection Time: 02/10/21  3:11 PM    Specimen: Nasopharyngeal   Result Value Ref Range    Adenovirus Not detected  NOTD      Coronavirus 229E Not detected NOTD      Coronavirus HKU1 Not detected NOTD      Coronavirus CVNL63 Not detected NOTD      Coronavirus OC43 Not detected NOTD      SARS-CoV-2, PCR Not detected NOTD      Metapneumovirus Not detected NOTD      Rhinovirus and Enterovirus Detected (A) NOTD      Influenza A Not detected NOTD      Influenza B Not detected NOTD      Parainfluenza 1 Not detected NOTD      Parainfluenza 2 Not detected NOTD      Parainfluenza 3 Not detected NOTD      Parainfluenza virus 4 Not detected NOTD      RSV by PCR Not detected NOTD      B. parapertussis, PCR Not detected NOTD      Bordetella pertussis - PCR Not detected NOTD      Chlamydophila pneumoniae DNA, QL, PCR Not detected NOTD      Mycoplasma pneumoniae DNA, QL, PCR Not detected NOTD       CT CHEST WO CONT   Final Result      Minimal bronchitis/reactive airways with perhaps tiny areas of distal   endobronchial mucus/infiltrate. No confluent consolidation.      Borderline prominent precarinal node, nonspecific.      XR CHEST PORT   Final Result      No acute pulmonic disease.                Medical Decision Making  I am the provider of record for this patient.    The patient is a 26year old woman with past medical history significant for asthma, who presents to the ED today for shortness of breath.  She is  tachycardic, and has a temperature of 99.2.  Additionally she does have wheezing throughout all lung fields despite using her nebulizer machine every hour since 6 AM.  Her COVID and flu are negative.  Her chest x-ray was negative as well.  She received duo nebs in the ED x2.  At that point she got up and walked and she desatted into the 66s.  I then ordered IV Solu-Medrol, IV magnesium, and labs.  Her D-dimer is negative and I have a low suspicion for PE.  She was feeling better and was on 2 L nasal cannula but then felt like her chest started to get tight again and she had increasing shortness of breath.  At that point we gave her  40 mg of IV Solu-Medrol.  She did not want another albuterol treatment at this time.  After the Solu-Medrol, she began vomiting and her heart rate went up into the 140s and she desatted to the mid 80s on 2 L nasal cannula.  I have ordered IV Zofran.    At this point, given the fact that the patient is vomiting, she is not a great candidate for BiPAP.  Because of the potential need for intubation in the near future, I have consulted the [facility] view ED for transfer.  Dr. Deborah Chalk has accepted the patient in ED to ED transfer.    Amount and/or Complexity of Data Reviewed  External Data Reviewed: labs, radiology, ECG and notes.  Labs: ordered. Decision-making details documented in ED Course.  Radiology: ordered and independent interpretation performed.  ECG/medicine tests: ordered and independent interpretation performed.    Risk  Prescription drug management.  Decision regarding hospitalization.           Critical Care  Performed by: Terie Purser, MD  Authorized by: Terie Purser, MD     Critical care provider statement:     Critical care time (minutes):  45    Critical care time was exclusive of:  Separately billable procedures and treating other patients and teaching time    Critical care was necessary to treat or prevent imminent or life-threatening deterioration of the following conditions:  Respiratory failure, cardiac failure and circulatory failure    Critical care was time spent personally by me on the following activities:  Development of treatment plan with patient or surrogate, discussions with consultants, evaluation of patient's response to treatment, examination of patient, obtaining history from patient or surrogate, ordering and performing treatments and interventions, ordering and review of laboratory studies, ordering and review of radiographic studies, pulse oximetry, re-evaluation of patient's condition and review of old charts    I assumed direction of critical care for this patient from  another provider in my specialty: no      Care discussed with: accepting provider at another facility

## 2021-02-10 NOTE — Progress Notes (Signed)
Patient placed on bipap for WOB. Adjusted settings for patient comfort. RT attempted ABG x3, no results. Advised patient I would try again later. Patient in bed in comfortable position.

## 2021-02-10 NOTE — ED Notes (Signed)
Patient moved from QC to bed 5.  Bedside report given to [facility] RN/George RN.  No further questions or concerns from receiving nurse.  Patient being transported to CT at this time.

## 2021-02-10 NOTE — ED Notes (Signed)
RT called for pt with c/o of bipap not working properly.  Labs sent over.  EKG obtain but ED provider refused to sign.

## 2021-02-10 NOTE — ED Notes (Signed)
RR 46, O2 sats 94-96% on 4L NC. Provider and RT at bedside

## 2021-02-10 NOTE — ED Notes (Signed)
Pt started vomiting after IV steroids. Zofran ordered by MD and administered. Pt to be transferred to [facility].

## 2021-02-10 NOTE — ED Notes (Signed)
 TRANSFER - OUT REPORT:    Verbal report given to AlisaLewis, RN(name) on Stephanie Murray  being transferred to [facility] ED(unit) for routine progression of care       Report consisted of patient's Situation, Background, Assessment and   Recommendations(SBAR).     Information from the following report(s) SBAR and ED Summary was reviewed with the receiving nurse.    Lines:   Peripheral IV 02/10/21 Right Antecubital (Active)        Opportunity for questions and clarification was provided.      Patient transported with:   Monitor  O2 @ 3 liters

## 2021-02-10 NOTE — H&P (Signed)
H&P by Durward Mallard,  NP at 02/10/21 1810                Author: Durward Mallard, NP  Service: Hospitalist  Author Type: Nurse Practitioner       Filed: 02/10/21 1926  Date of Service: 02/10/21 1810  Status: Attested           Editor: Durward Mallard, NP (Nurse Practitioner)  Cosigner: Carrolyn Meiers, MD at 02/10/21 2240          Attestation signed by Carrolyn Meiers, MD at 02/10/21 2240          I have personally saw and examined the patient. I have reviewed and agreed with Cornelius Moras Kathrene Sinopoli's findings, including all diagnostic interpretations, and plans  as written.        She presented to the ED due to worsening shortness of breath despite nebulizer treatment at home.  She stated her asthma exacerbation usually resolve without much intervention.  She maintained cough, no fever.  In the ED she was found tachycardic tachypneic  and hypoxia on room air.  70% saturation O2 when she ambulated on room air.  She was placed on Solu-Medrol nebulizer treatment and magnesium with minimum symptom resolution.  Initial COVID work-up was negative.  Respiratory panel was positive for rhino  and enterovirus.  CT of the chest showed bronchial inflammation possible early infiltrate.   She was transferred from [facility] to [facility], she continue to be in significant respiratory distress, she was placed on BiPAP support which she tolerated well.      ROS:  No fever/chills, no headache, no dizziness, no facial pain, + sinus congestion, + cough   No swallowing pain, No chest pain, no palpitation, + shortness of breath, no abd pain,   No diarrhea, no urinary complaint, no leg pain or swelling          Visit Vitals   BP  120/60      Pulse  (!) 135      Temp  97.5 ??F (36.4 ??C)      Resp  19      Ht  5' 1"  (1.549 m)      Wt  87.1 kg (192 lb)      SpO2  100%      Breastfeeding  Unknown      BMI  36.28 kg/m??            Exam:   Tele: Sinus tachycardia   General:  in acute distress, speaks in very short sentence while in bed   HEENT: PERRL,  EOMI, supple neck, no JVD, dry oral mucosa   Cardiovascular: S1S2 tachycardia, no rub/gallop    Pulmonary: Diminished air entry bilaterally, and expiratory wheezing, no crackle, using accessory muscles   GI:  Soft, non tender, non distended, +bs, no guarding    Extremities:  No pedal edema, +distal pulses appreciated    Neuro: AOx3, moving all extremities      Lab Results      Component  Value  Date/Time        WBC  7.5  02/10/2021 01:30 PM        HGB  11.5 (L)  02/10/2021 01:30 PM        HCT  36.4  02/10/2021 01:30 PM        PLATELET  394  02/10/2021 01:30 PM        MCV  75.8 (L)  02/10/2021 01:30 PM  Lab Results      Component  Value  Date/Time        Sodium  140  02/10/2021 01:30 PM        Potassium  3.7  02/10/2021 01:30 PM        Chloride  108  02/10/2021 01:30 PM        CO2  25  02/10/2021 01:30 PM        Anion gap  7  02/10/2021 01:30 PM        Glucose  80  02/10/2021 01:30 PM        BUN  9  02/10/2021 01:30 PM        Creatinine  0.82  02/10/2021 01:30 PM        BUN/Creatinine ratio  11 (L)  02/10/2021 01:30 PM        Calcium  8.9  02/10/2021 01:30 PM        Bilirubin, total  0.2  02/10/2021 01:30 PM        Alk. phosphatase  99  02/10/2021 01:30 PM        Protein, total  7.4  02/10/2021 01:30 PM        Albumin  3.6  02/10/2021 01:30 PM        Globulin  3.8  02/10/2021 01:30 PM        A-G Ratio  0.9  02/10/2021 01:30 PM        ALT (SGPT)  19  02/10/2021 01:30 PM        AST (SGOT)  14  02/10/2021 01:30 PM            CT Results (most recent):   Results from Hospital Encounter encounter on 02/10/21      CT CHEST WO CONT      Narrative   EXAMINATION: CT chest without contrast      INDICATION: Shortness of breath, asthma   Impression   Minimal bronchitis/reactive airways with perhaps tiny areas of distal   endobronchial mucus/infiltrate. No confluent consolidation.      Borderline prominent precarinal node, nonspecific.      No current facility-administered medications on file prior to encounter.             Current Outpatient Medications on File Prior to Encounter      Medication  Sig  Dispense  Refill      ?  albuterol (Proventil HFA) 90 mcg/actuation inhaler  Take 2 Puffs by inhalation every four (4) hours as needed for Wheezing. (Patient not taking: Reported on 02/10/2021)  1 Each  0      ?  albuterol (PROVENTIL VENTOLIN) 2.5 mg /3 mL (0.083 %) nebu  Take 2.5 mg by inhalation every four (4) hours as needed.          ?  albuterol (PROVENTIL HFA, VENTOLIN HFA, PROAIR HFA) 90 mcg/actuation inhaler  Take 1-2 Puffs by inhalation. (Patient not taking: Reported on 02/10/2021)          ?  albuterol-ipratropium (DUO-NEB) 2.5 mg-0.5 mg/3 ml nebu  3 mL by Nebulization route every four (4) hours as needed for Wheezing.          ?  fluticasone/umeclidin/vilanter (TRELEGY ELLIPTA IN)  Take 2 Puffs by inhalation two (2) times a day. 213mg (Patient not taking: Reported on 02/10/2021)          ?  ipratropium (Atrovent HFA) 17 mcg/actuation inhaler  Take 1 Puff by inhalation every six (6) hours as needed  for Wheezing. (Patient not taking: Reported on 02/10/2021)  12.9 g  0               AP:   1.  Acute respiratory failure with hypoxia   2.  Acute bronchitis with rhinovirus and enterovirus   3.  Possible early development of bacterial pneumonia   4.  Moderate persistent asthma with acute exacerbation, possible status asthmaticus   5.  SIRS with viral bronchitis, possible sepsis on admission   6.  Morbid obesity      Admit to stepdown to continue BiPAP support.  Spoke with RT to get ABG to help adjust BiPAP setting.  Continue Solu-Medrol 40 mg every 6 hours, continue DuoNebs every 4 hours continue desonide/arformoterol nebs every 12 hours.   Droplet precaution   At azithromycin and ceftriaxone for CAP coverage at this time, follow-up on sputum culture, follow-up on procalcitonin.   At guaifenesin with codeine for the next 3 doses to help with cough.   Add Singulair nightly   Can give magnesium sulfate IV x1 right now   Please consult  pulmonary tomorrow if her symptoms persist despite current treatment.      Full code         Time spent >55 minutes   Signed By:  Carrolyn Meiers, MD         February 10, 2021                                           History and Physical                Subjective        HPI: Luane Bryant-Walker is a 26 y.o. female with a PMHx of asthma who presented to [facility] from [facility] for further management. Patient states  since 6 PM yesterday evening she started having mild shortness of breath which was relieved by PRN nebulizer, but over time her symptoms worsened. Since 6 AM this morning she has had to use her nebulizer every one hour until 10 AM, which is when she decided  she needs to go to the hospital. Does not use oxygen at home. She has had episodes of asthma exacerbations in the past with last time being in 2021. She also has a mild productive cough since one week. Upon my examination, patient is on 4L oxygen via  NC sating at 92-94% with conversational dyspnea. Tachycardic, but is post albuterol neb treatment. Denies smoking cigarettes, drinking alcohol. Admits to smoking marijuana once a week. Denies chest pain, fever, chills, nvd, abdominal pain, urinary problems,  headache, dizziness.       At [facility], Tmax 99.2, HR 110-135, BP 1237/77 -  , Resp 19-26, O2 97% 3L. CBC, CMP without abnormality. D.Dimer normal. Hcg negative. Flu/COVID negative. Rhinovirus positive. CXR negative. CT chest with minimal bronchitis with possible pneumonia.    Received albuterol x2, mag 2g, solumedrol x2, singulair, zofran and 1L NS bolus at [facility].       Patient does not take any medications at home. Only uses PRN nebs.       PMHx:     Past Medical History:        Diagnosis  Date         ?  Asthma             PSurgHx:   No past surgical history on  file.      SocialHx:     Social History          Socioeconomic History         ?  Marital status:  SINGLE       Tobacco Use         ?  Smoking status:  Never     ?  Smokeless tobacco:  Never       Substance  and Sexual Activity         ?  Alcohol use:  Not Currently         ?  Drug use:  Never           FamilyHx:   No family history on file.      Home Medications:     Prior to Admission Medications     Prescriptions  Last Dose  Informant  Patient Reported?  Taking?      albuterol (PROVENTIL HFA, VENTOLIN HFA, PROAIR HFA) 90 mcg/actuation inhaler      Yes  No      Sig: Take 1-2 Puffs by inhalation.      albuterol (PROVENTIL VENTOLIN) 2.5 mg /3 mL (0.083 %) nebu      Yes  No      Sig: Take 2.5 mg by inhalation every four (4) hours as needed.      albuterol (Proventil HFA) 90 mcg/actuation inhaler      No  No      Sig: Take 2 Puffs by inhalation every four (4) hours as needed for Wheezing.      albuterol-ipratropium (DUO-NEB) 2.5 mg-0.5 mg/3 ml nebu      Yes  No      Sig: 3 mL by Nebulization route every four (4) hours as needed for Wheezing.      fluticasone/umeclidin/vilanter (TRELEGY ELLIPTA IN)      Yes  No      Sig: Take 2 Puffs by inhalation two (2) times a day. 236mg      ipratropium (Atrovent HFA) 17 mcg/actuation inhaler      No  No      Sig: Take 1 Puff by inhalation every six (6) hours as needed for Wheezing.               Facility-Administered Medications: None           Allergies:     Allergies        Allergen  Reactions         ?  Sulfa (Sulfonamide Antibiotics)  Hives            Review of Systems:   CONST: no fever or chills, no fatigue   ENT: No earache, no tinnitus, no sore throat or sinus congestion.    PULM: + shortness of breath, + cough + wheeze.    CV: no pnd or orthopnea, no CP, no palpitations, no edema   GI: No abdominal pain, no nausea, no vomiting or diarrhea   GU: No urinary frequency, no urgency, no hesitancy or dysuria.    MSK: No joint or muscle pain, no back pain, no neck pain, no recent trauma.    INTEG: No rash, no itching, no lesions.    ENDO: No polyuria, no polydipsia, no heat or cold intolerance.    HEME: No anemia or easy bruising or bleeding.    NEURO: No headache, no dizziness, no  seizures, no numbness, no tingling or weakness.    PSYCH: No anxiety,  no depression           Objective        Physical Exam:   Visit Vitals      BP  120/60     Pulse  (!) 135     Temp  97.5 ??F (36.4 ??C)     Resp  19     Ht  5' 1"  (1.549 m)     Wt  87.1 kg (192 lb)     SpO2  93%     Breastfeeding  Unknown        BMI  36.28 kg/m??           General: NAD, appears stated age, alert   Skin: warm, dry, no rashes   Eyes: PERRL, sclera is non-icteric   HENT: normocephalic/atraumatic, moist mucus membranes   Respiratory: Inspiratory and Expiratory wheezing to all lung fields    Cardiovascular: tachycardia, no m/r/g, no cyanosis or peripheral edema of extremities   GI: soft, non-tender, normal bowel sounds   Neuro: moves all extremities, no focal deficits, normal speech   Psych: appropriate mood and affect, no visual or auditory hallucinations      Laboratory Studies:     Recent Results (from the past 24 hour(s))     INFLUENZA A & B AG (RAPID TEST)          Collection Time: 02/10/21 12:15 PM         Result  Value  Ref Range            Influenza A Antigen  Negative  NEG         Influenza B Antigen  Negative  NEG         COVID-19 RAPID TEST          Collection Time: 02/10/21 12:15 PM         Result  Value  Ref Range            Specimen source  Nasopharyngeal               COVID-19 rapid test  Not detected          CBC WITH AUTOMATED DIFF          Collection Time: 02/10/21  1:30 PM         Result  Value  Ref Range            WBC  7.5  4.6 - 13.2 K/uL       RBC  4.80  4.20 - 5.30 M/uL       HGB  11.5 (L)  12.0 - 16.0 g/dL       HCT  36.4  35.0 - 45.0 %       MCV  75.8 (L)  78.0 - 100.0 FL       MCH  24.0  24.0 - 34.0 PG       MCHC  31.6  31.0 - 37.0 g/dL       RDW  17.8 (H)  11.6 - 14.5 %       PLATELET  394  135 - 420 K/uL       MPV  9.2  9.2 - 11.8 FL       NRBC  0.0  0 PER 100 WBC       ABSOLUTE NRBC  0.00  0.00 - 0.01 K/uL       NEUTROPHILS  66  40 - 73 %  LYMPHOCYTES  25  21 - 52 %       MONOCYTES  4  3 - 10 %        EOSINOPHILS  4  0 - 5 %       BASOPHILS  0  0 - 2 %       IMMATURE GRANULOCYTES  0  0.0 - 0.5 %       ABS. NEUTROPHILS  5.0  1.8 - 8.0 K/UL       ABS. LYMPHOCYTES  1.9  0.9 - 3.6 K/UL       ABS. MONOCYTES  0.3  0.05 - 1.2 K/UL       ABS. EOSINOPHILS  0.3  0.0 - 0.4 K/UL       ABS. BASOPHILS  0.0  0.0 - 0.1 K/UL       ABS. IMM. GRANS.  0.0  0.00 - 0.04 K/UL       DF  AUTOMATED          METABOLIC PANEL, COMPREHENSIVE          Collection Time: 02/10/21  1:30 PM         Result  Value  Ref Range            Sodium  140  136 - 145 mmol/L            Potassium  3.7  3.5 - 5.5 mmol/L            Chloride  108  100 - 111 mmol/L       CO2  25  21 - 32 mmol/L       Anion gap  7  3.0 - 18 mmol/L       Glucose  80  74 - 99 mg/dL       BUN  9  7.0 - 18 MG/DL       Creatinine  0.82  0.6 - 1.3 MG/DL       BUN/Creatinine ratio  11 (L)  12 - 20         eGFR  >60  >60 ml/min/1.59m       Calcium  8.9  8.5 - 10.1 MG/DL       Bilirubin, total  0.2  0.2 - 1.0 MG/DL       ALT (SGPT)  19  13 - 56 U/L       AST (SGOT)  14  10 - 38 U/L       Alk. phosphatase  99  45 - 117 U/L       Protein, total  7.4  6.4 - 8.2 g/dL       Albumin  3.6  3.4 - 5.0 g/dL       Globulin  3.8  2.0 - 4.0 g/dL       A-G Ratio  0.9  0.8 - 1.7         NT-PRO BNP          Collection Time: 02/10/21  1:30 PM         Result  Value  Ref Range            NT pro-BNP  6  0 - 450 PG/ML       TROPONIN-HIGH SENSITIVITY          Collection Time: 02/10/21  1:30 PM         Result  Value  Ref Range  Troponin-High Sensitivity  <3  0 - 54 ng/L       D DIMER          Collection Time: 02/10/21  1:30 PM         Result  Value  Ref Range            D DIMER  0.42  <0.46 ug/ml(FEU)       HCG QL SERUM          Collection Time: 02/10/21  1:30 PM         Result  Value  Ref Range            HCG, Ql.  Negative  NEG         PROCALCITONIN          Collection Time: 02/10/21  1:30 PM         Result  Value  Ref Range            Procalcitonin  <0.05  ng/mL       RESPIRATORY VIRUS PANEL  W/COVID-19, PCR          Collection Time: 02/10/21  3:11 PM       Specimen: Nasopharyngeal         Result  Value  Ref Range            Adenovirus  Not detected  NOTD         Coronavirus 229E  Not detected  NOTD         Coronavirus HKU1  Not detected  NOTD         Coronavirus CVNL63  Not detected  NOTD         Coronavirus OC43  Not detected  NOTD         SARS-CoV-2, PCR  Not detected  NOTD         Metapneumovirus  Not detected  NOTD         Rhinovirus and Enterovirus  Detected (A)  NOTD         Influenza A  Not detected  NOTD         Influenza B  Not detected  NOTD         Parainfluenza 1  Not detected  NOTD         Parainfluenza 2  Not detected  NOTD         Parainfluenza 3  Not detected  NOTD         Parainfluenza virus 4  Not detected  NOTD         RSV by PCR  Not detected  NOTD         B. parapertussis, PCR  Not detected  NOTD         Bordetella pertussis - PCR  Not detected  NOTD         Chlamydophila pneumoniae DNA, QL, PCR  Not detected  NOTD              Mycoplasma pneumoniae DNA, QL, PCR  Not detected  NOTD             Imaging Reviewed:   CT CHEST WO CONT      Result Date: 02/10/2021   EXAMINATION: CT chest without contrast INDICATION: Shortness of breath, asthma COMPARISON: None TECHNIQUE: CT chest without contrast. All CT scans at this facility are performed using dose optimization technique as appropriate to a performed exam, to  include automated exposure control, adjustment of the mA  and/or kV according to patient size (including appropriate matching first site specific examinations), or use of iterative reconstruction technique. FINDINGS: Evaluation of soft tissues and vessels  limited without vascular enhancement. Cardiovascular: Major vessels normal in course and caliber. Heart size normal. Mediastinum: Imaged thyroid unremarkable. Borderline prominent precarinal node. Esophagus nondistended. Pleura: No pleural effusion. No  pneumothorax. Lungs/airways: No suspicious focal bronchial lesions. Minimal  bronchial wall thickening perhaps tiny areas of distal endobronchial mucus/infiltrate. No confluent consolidation. Lingular likely streaky atelectasis. Upper abdomen: No acute  findings. Miscellaneous: Superficial soft tissues unremarkable. Bones: No acute osseous findings.       Minimal bronchitis/reactive airways with perhaps tiny areas of distal endobronchial mucus/infiltrate. No confluent consolidation. Borderline prominent precarinal node, nonspecific.      XR CHEST PORT      Result Date: 02/10/2021   EXAM: CHEST  CPT CODE: 66063 CLINICAL INDICATION/HISTORY: Asthma attack; nebulizer treatment without relief. COMPARISON: None. TECHNIQUE: Single AP portable view of chest at 1213. FINDINGS: There are clear lungs and sharp pleural margins.  The cardiomediastinal  silhouette is normal.  The bones and soft tissues are unremarkable.       No acute pulmonic disease.                Assessment/Plan           Hospital Problems   Never Reviewed                            Codes  Class  Noted  POA              Acute asthma exacerbation  ICD-10-CM: J45.901   ICD-9-CM: 493.92    02/10/2021  Unknown                        Rhinovirus infection  ICD-10-CM: B34.8   ICD-9-CM: 079.3    02/10/2021  Yes                     Acute Asthma Exacerbation with + Rhinovirus and possible Superimposed Bacterial Pneumonia- procal,ABG ordered    - Scheduled brovana, pulmicort, Duoneb (tachycardia noted from duonebs, patient asymptomatic, continue to monitor)    - Solu-Medrol 53m IV q8h    - IV abx- Azithro and Ceftriaxone    - Obtain blood/sputum culture   - Monitor temp curve, CBC   - IS/PPI      Anticipated Discharge: 2-3 days       DVT Prophylaxis:  [x] Lovenox  [] Hep SQ  []  SCDs  [] Coumadin [] DOAC  []  On Heparin gtt       I have personally reviewed all pertinent labs, films and EKGs that have officially resulted. I reviewed available electronic documentation outlining the initial presentation as well as the emergency room physician's  encounter.      Time spent reviewing records, independently interpreting results, obtaining history from patient or caregiver, performing physical exam, ordering tests and medications, communicating with specialists, documenting in the chart, and coordinating overall  care is  >55 minutes       KDurward Mallard FNP-C   [facility]  Office:  [phone]

## 2021-02-10 NOTE — ED Notes (Signed)
Asthma attack since 0600. States she's taken a nebulizer treatment every hour since without relief

## 2021-02-10 NOTE — ED Notes (Signed)
Pt c/o chest pain and tightness.  Made MD aware.  repeat EKG and trops ordered.

## 2021-02-11 ENCOUNTER — Inpatient Hospital Stay: Admit: 2021-02-11 | Payer: PRIVATE HEALTH INSURANCE | Primary: Family

## 2021-02-11 LAB — CBC
Hematocrit: 40.8 % (ref 35.0–45.0)
Hemoglobin: 12.9 g/dL (ref 12.0–16.0)
MCH: 23.9 PG — ABNORMAL LOW (ref 24.0–34.0)
MCHC: 31.6 g/dL (ref 31.0–37.0)
MCV: 75.6 FL — ABNORMAL LOW (ref 78.0–100.0)
MPV: 9.2 FL (ref 9.2–11.8)
NRBC Absolute: 0 10*3/uL (ref 0.00–0.01)
Nucleated RBCs: 0 PER 100 WBC
Platelets: 448 10*3/uL — ABNORMAL HIGH (ref 135–420)
RBC: 5.4 M/uL — ABNORMAL HIGH (ref 4.20–5.30)
RDW: 18.3 % — ABNORMAL HIGH (ref 11.6–14.5)
WBC: 17.2 10*3/uL — ABNORMAL HIGH (ref 4.6–13.2)

## 2021-02-11 LAB — BLOOD GAS, ARTERIAL POC
Allens test (POC): POSITIVE
BASE DEFICIT (POC): 2.9 mmol/L
Base deficit (POC): 2.9 mmol/L
FIO2 (POC): 75 %
FIO2: 75 %
HCO3 (POC): 21.9 MMOL/L — ABNORMAL LOW (ref 22–26)
HCO3, Art: 21.9 MMOL/L — ABNORMAL LOW (ref 22–26)
PEEP/CPAP (POC): 8 cm[H2O]
PIP (POC): 19
PIP (POC): 19
POC Allen's Test: POSITIVE
POC O2 SAT: 98.8 % — ABNORMAL HIGH (ref 92–97)
PRESSURE SUPPORT: 10 cmH2O
Peep/Cpap: 8 cmH2O
Pressure support: 10 cm[H2O]
Rate: 8 {beats}/min
Respiratory Rate: 31
Set Rate: 8 {beats}/min
Total resp. rate: 31
pCO2 (POC): 37.4 mm[Hg] (ref 35.0–45.0)
pCO2, Art: 37.4 MMHG (ref 35.0–45.0)
pH (POC): 7.38 (ref 7.35–7.45)
pH, Art: 7.38 (ref 7.35–7.45)
pO2 (POC): 125 MMHG — ABNORMAL HIGH (ref 80–100)
pO2, Art: 125 MMHG — ABNORMAL HIGH (ref 80–100)
sO2 (POC): 98.8 % — ABNORMAL HIGH (ref 92–97)

## 2021-02-11 LAB — BASIC METABOLIC PANEL
Anion Gap: 8 mmol/L (ref 3.0–18)
BUN: 9 MG/DL (ref 7.0–18)
Bun/Cre Ratio: 11 — ABNORMAL LOW (ref 12–20)
CO2: 22 mmol/L (ref 21–32)
Calcium: 9.8 MG/DL (ref 8.5–10.1)
Chloride: 105 mmol/L (ref 100–111)
Creatinine: 0.81 MG/DL (ref 0.6–1.3)
ESTIMATED GLOMERULAR FILTRATION RATE: >60 mL/min/{1.73_m2} (ref >60)
Glucose: 122 mg/dL — ABNORMAL HIGH (ref 74–99)
Potassium: 4.9 mmol/L (ref 3.5–5.5)
Sodium: 135 mmol/L — ABNORMAL LOW (ref 136–145)

## 2021-02-11 LAB — EKG 12-LEAD
Atrial Rate: 123 {beats}/min
P Axis: 62 degrees
P-R Interval: 130 ms
Q-T Interval: 290 ms
QRS Duration: 84 ms
QTc Calculation (Bazett): 415 ms
R Axis: 52 degrees
T Axis: 28 degrees
Ventricular Rate: 123 {beats}/min

## 2021-02-11 LAB — HEMOGLOBIN A1C W/EAG
Hemoglobin A1C: 5.5 % (ref 4.2–5.6)
eAG: 111 mg/dL

## 2021-02-11 LAB — TROPONIN, HIGH SENSITIVITY
Troponin, High Sensitivity: <3 ng/L (ref 0–54)
Troponin, High Sensitivity: <3 ng/L (ref 0–54)

## 2021-02-11 LAB — EKG, 12 LEAD, INITIAL
Atrial Rate: 123 {beats}/min
Calculated P Axis: 62 degrees
Calculated R Axis: 52 degrees
Calculated T Axis: 28 degrees
P-R Interval: 130 ms
Q-T Interval: 290 ms
QRS Duration: 84 ms
QTC Calculation (Bezet): 415 ms
Ventricular Rate: 123 {beats}/min

## 2021-02-11 LAB — CBC W/O DIFF
ABSOLUTE NRBC: 0 10*3/uL (ref 0.00–0.01)
HCT: 40.8 % (ref 35.0–45.0)
HGB: 12.9 g/dL (ref 12.0–16.0)
MCH: 23.9 PG — ABNORMAL LOW (ref 24.0–34.0)
MCHC: 31.6 g/dL (ref 31.0–37.0)
MCV: 75.6 FL — ABNORMAL LOW (ref 78.0–100.0)
MPV: 9.2 FL (ref 9.2–11.8)
NRBC: 0 PER 100 WBC
PLATELET: 448 10*3/uL — ABNORMAL HIGH (ref 135–420)
RBC: 5.4 M/uL — ABNORMAL HIGH (ref 4.20–5.30)
RDW: 18.3 % — ABNORMAL HIGH (ref 11.6–14.5)
WBC: 17.2 10*3/uL — ABNORMAL HIGH (ref 4.6–13.2)

## 2021-02-11 LAB — METABOLIC PANEL, BASIC
Anion gap: 8 mmol/L (ref 3.0–18)
BUN/Creatinine ratio: 11 — ABNORMAL LOW (ref 12–20)
BUN: 9 MG/DL (ref 7.0–18)
CO2: 22 mmol/L (ref 21–32)
Calcium: 9.8 MG/DL (ref 8.5–10.1)
Chloride: 105 mmol/L (ref 100–111)
Creatinine: 0.81 MG/DL (ref 0.6–1.3)
Glucose: 122 mg/dL — ABNORMAL HIGH (ref 74–99)
Potassium: 4.9 mmol/L (ref 3.5–5.5)
Sodium: 135 mmol/L — ABNORMAL LOW (ref 136–145)
eGFR: >60 mL/min/{1.73_m2} (ref >60)

## 2021-02-11 LAB — HEMOGLOBIN A1C WITH EAG
Est. average glucose: 111 mg/dL
Hemoglobin A1c: 5.5 % (ref 4.2–5.6)

## 2021-02-11 LAB — TROPONIN-HIGH SENSITIVITY
Troponin-High Sensitivity: <3 ng/L (ref 0–54)
Troponin-High Sensitivity: <3 ng/L (ref 0–54)

## 2021-02-11 MED ORDER — ONDANSETRON (PF) 4 MG/2 ML INJECTION
4 mg/2 mL | Freq: Four times a day (QID) | INTRAMUSCULAR | Status: DC | PRN
Start: 2021-02-11 — End: 2021-02-14

## 2021-02-11 MED ORDER — ACETAMINOPHEN 650 MG RECTAL SUPPOSITORY
650 mg | Freq: Four times a day (QID) | RECTAL | Status: DC | PRN
Start: 2021-02-11 — End: 2021-02-14

## 2021-02-11 MED ORDER — ALBUTEROL SULFATE 0.083 % (0.83 MG/ML) SOLN FOR INHALATION
2.530.083 mg /3 mL (0.083 %) | RESPIRATORY_TRACT | Status: DC
Start: 2021-02-11 — End: 2021-02-14
  Administered 2021-02-12 – 2021-02-14 (×15): via RESPIRATORY_TRACT

## 2021-02-11 MED ORDER — CODEINE-GUAIFENESIN 10 MG-100 MG/5 ML ORAL LIQUID
100-10 mg/5 mL | Freq: Four times a day (QID) | ORAL | Status: AC
Start: 2021-02-11 — End: 2021-02-11
  Administered 2021-02-11 (×3): via ORAL

## 2021-02-11 MED ORDER — ACETAMINOPHEN 325 MG TABLET
325 mg | Freq: Four times a day (QID) | ORAL | Status: DC | PRN
Start: 2021-02-11 — End: 2021-02-14

## 2021-02-11 MED ORDER — INSULIN LISPRO 100 UNIT/ML INJECTION
100 unit/mL | Freq: Four times a day (QID) | SUBCUTANEOUS | Status: DC
Start: 2021-02-11 — End: 2021-02-14

## 2021-02-11 MED ORDER — DEXTROSE 10% IN WATER (D10W) IV
10 % | INTRAVENOUS | Status: DC | PRN
Start: 2021-02-11 — End: 2021-02-14

## 2021-02-11 MED ORDER — GUAIFENESIN 100 MG/5 ML ORAL LIQUID
100 mg/5 mL | ORAL | Status: DC | PRN
Start: 2021-02-11 — End: 2021-02-14

## 2021-02-11 MED ORDER — SODIUM CHLORIDE 0.9 % IJ SYRG
INTRAMUSCULAR | Status: DC | PRN
Start: 2021-02-11 — End: 2021-02-14

## 2021-02-11 MED ORDER — BUDESONIDE 1 MG/2 ML NEB SUSPENSION
1 mg/2 mL | Freq: Two times a day (BID) | RESPIRATORY_TRACT | Status: DC
Start: 2021-02-11 — End: 2021-02-14
  Administered 2021-02-11 – 2021-02-14 (×6): via RESPIRATORY_TRACT

## 2021-02-11 MED ORDER — SODIUM CHLORIDE 0.9 % IJ SYRG
Freq: Three times a day (TID) | INTRAMUSCULAR | Status: DC
Start: 2021-02-11 — End: 2021-02-14
  Administered 2021-02-11 – 2021-02-14 (×10): via INTRAVENOUS

## 2021-02-11 MED ORDER — MAGNESIUM SULFATE 2 GRAM/50 ML IVPB
2 gram/50 mL (4 %) | Freq: Once | INTRAVENOUS | Status: AC
Start: 2021-02-11 — End: 2021-02-10
  Administered 2021-02-11: 01:00:00 via INTRAVENOUS

## 2021-02-11 MED ORDER — ONDANSETRON 4 MG TAB, RAPID DISSOLVE
4 mg | Freq: Three times a day (TID) | ORAL | Status: DC | PRN
Start: 2021-02-11 — End: 2021-02-14

## 2021-02-11 MED ORDER — SODIUM CHLORIDE 0.9 % IV
Freq: Once | INTRAVENOUS | Status: AC
Start: 2021-02-11 — End: 2021-02-11
  Administered 2021-02-11: 17:00:00 via INTRAVENOUS

## 2021-02-11 MED ORDER — ENOXAPARIN 40 MG/0.4 ML SUB-Q SYRINGE
40 mg/0.4 mL | Freq: Every day | SUBCUTANEOUS | Status: DC
Start: 2021-02-11 — End: 2021-02-14
  Administered 2021-02-11 – 2021-02-14 (×4): via SUBCUTANEOUS

## 2021-02-11 MED ORDER — GLUCOSE 4 GRAM CHEWABLE TAB
4 gram | ORAL | Status: DC | PRN
Start: 2021-02-11 — End: 2021-02-14

## 2021-02-11 MED ORDER — GLUCAGON 1 MG INJECTION
1 mg | INTRAMUSCULAR | Status: DC | PRN
Start: 2021-02-11 — End: 2021-02-14

## 2021-02-11 MED ORDER — METHYLPREDNISOLONE (PF) 40 MG/ML IJ SOLR
40 mg/mL | Freq: Two times a day (BID) | INTRAMUSCULAR | Status: DC
Start: 2021-02-11 — End: 2021-02-11

## 2021-02-11 MED ORDER — POLYETHYLENE GLYCOL 3350 17 GRAM (100 %) ORAL POWDER PACKET
17 gram | Freq: Every day | ORAL | Status: DC | PRN
Start: 2021-02-11 — End: 2021-02-14

## 2021-02-11 MED ORDER — IOPAMIDOL 61 % IV SOLN
300 mg iodine /mL (61 %) | Freq: Once | INTRAVENOUS | Status: AC
Start: 2021-02-11 — End: 2021-02-11
  Administered 2021-02-11: 22:00:00 via INTRAVENOUS

## 2021-02-11 MED ORDER — MONTELUKAST 10 MG TAB
10 mg | Freq: Every evening | ORAL | Status: DC
Start: 2021-02-11 — End: 2021-02-14
  Administered 2021-02-12 – 2021-02-14 (×3): via ORAL

## 2021-02-11 MED ORDER — IPRATROPIUM-ALBUTEROL 2.5 MG-0.5 MG/3 ML NEB SOLUTION
2.5 mg-0.5 mg/3 ml | RESPIRATORY_TRACT | Status: DC
Start: 2021-02-11 — End: 2021-02-11

## 2021-02-11 MED FILL — CODEINE-GUAIFENESIN 10 MG-100 MG/5 ML ORAL LIQUID: 100-10 mg/5 mL | ORAL | Qty: 10

## 2021-02-11 MED FILL — BUDESONIDE 0.5 MG/2 ML NEB SUSPENSION: 0.5 mg/2 mL | RESPIRATORY_TRACT | Qty: 1

## 2021-02-11 MED FILL — CEFTRIAXONE 1 GRAM SOLUTION FOR INJECTION: 1 gram | INTRAMUSCULAR | Qty: 1

## 2021-02-11 MED FILL — ALBUTEROL SULFATE 0.083 % (0.83 MG/ML) SOLN FOR INHALATION: 2.5 mg /3 mL (0.083 %) | RESPIRATORY_TRACT | Qty: 1

## 2021-02-11 MED FILL — FAMOTIDINE 20 MG TAB: 20 mg | ORAL | Qty: 1

## 2021-02-11 MED FILL — SOLU-MEDROL (PF) 40 MG/ML SOLUTION FOR INJECTION: 40 mg/mL | INTRAMUSCULAR | Qty: 1

## 2021-02-11 MED FILL — ISOVUE-300  61 % INTRAVENOUS SOLUTION: 300 mg iodine /mL (61 %) | INTRAVENOUS | Qty: 100

## 2021-02-11 MED FILL — BD POSIFLUSH NORMAL SALINE 0.9 % INJECTION SYRINGE: INTRAMUSCULAR | Qty: 40

## 2021-02-11 MED FILL — MAGNESIUM SULFATE 2 GRAM/50 ML IVPB: 2 gram/50 mL (4 %) | INTRAVENOUS | Qty: 50

## 2021-02-11 MED FILL — AZITHROMYCIN 500 MG IV SOLUTION: 500 mg | INTRAVENOUS | Qty: 5

## 2021-02-11 MED FILL — LOVENOX 40 MG/0.4 ML SUBCUTANEOUS SYRINGE: 40 mg/0.4 mL | SUBCUTANEOUS | Qty: 0.4

## 2021-02-11 MED FILL — ARFORMOTEROL 15 MCG/2 ML NEB SOLUTION: 15 mcg/2 mL | RESPIRATORY_TRACT | Qty: 2

## 2021-02-11 MED FILL — SODIUM CHLORIDE 0.9 % IV: INTRAVENOUS | Qty: 1000

## 2021-02-11 MED FILL — BUDESONIDE 1 MG/2 ML NEB SUSPENSION: 1 mg/2 mL | RESPIRATORY_TRACT | Qty: 1

## 2021-02-11 NOTE — ACP (Advance Care Planning) (Signed)
 Advance Care Planning  Advance Care Planning Inpatient Note  [facility]  Spiritual Care Department    Today's Date: 02/11/2021  Unit: [facility] EMERGENCY DEPT    Received request from . Upon review of chart and communication with care team, patient's decision making abilities are not in question. Patient was/were present in the room during visit.    Goals of ACP Conversation:  Discuss Advance Care planning documents    Health Care Decision Makers:    No healthcare decision makers have been documented.   Click here to complete Clinical research associate of the Healthcare Decision Maker Relationship (ie Primary)  Summary:  No Decision Maker named by patient at this time    Advance Care Planning Documents (Patient Wishes) on file:  None     Assessment:    There is no advance directive present.    Interventions:  Deferred conversation as patient not interested in completing an advance directive at this time    Care Preferences Communicated:  No    Outcomes/Plan:      Jackee JINNY Companion, [facility] on 02/11/2021 at 12:24 PM

## 2021-02-11 NOTE — Consults (Signed)
Consults by Mellody Memos, MD at 02/11/21 1952                Author: Mellody Memos, MD  Service: Pulmonary Disease  Author Type: Physician       Filed: 02/12/21 0121  Date of Service: 02/11/21 1952  Status: Signed          Editor: Mellody Memos, MD (Physician)            Consult Orders        1. IP CONSULT TO PULMONOLOGY [[id number]] ordered by Hebert Soho, NP at 02/11/21 [facility].   Pulmonary, Critical Care, and Sleep Medicine      Initial Patient Consult           Name:  Stephanie Murray  MRN:  [id number]          DOB:  Nov 09, 1995  Hospital:  [facility]          Date:  02/11/2021                This patient has been seen and evaluated at the request of Dr. Bernadene Bell for asthma exacerbation and neck swelling.         IMPRESSION:     ??    Asthma exacerbation with severe persistent asthma (poorly controlled):  Triggered by rhinovirus.  Patient has severe persistent asthma  at baseline (nightly nocturnal awakenings), only on SABA.  Had stopped ICS/LAMA/LABA combination therapy at the beginning of her pregnancy approximately 15 months ago at recommendation of her obstetrician.   ??  Viral LRTI: Secondary to rhinovirus   ??  Extensive pneumomediastinum with subcutaneous emphysema: Secondary to positive pressure ventilation in the setting of above --occurred on 02/11/2021   ??  Marijuana and tobacco dependence:  Smokes blunts once per week   ??  Insurance coverage issues: Patient reports she recently lost Medicaid coverage   ??  Severe obesity: Body mass index is 36.28 kg/m??.           Patient Active Problem List      Diagnosis  Code      ?  Acute asthma exacerbation  J45.901      ?  Rhinovirus infection  B34.8      ?  Acute bronchitis  J20.9      ?  Acute respiratory failure with hypoxia (HCC)  J96.01      ?  Obesity (BMI 30.0-34.9)  E66.9               RECOMMENDATIONS:     ??    Agree with CT neck.  Later reviewed images, shows  subcutaneous emphysema.  Discontinue BiPAP and avoid positive pressure ventilation.  Currently patient  has not had progression, limited to neck and upper chest, no airway compromise currently, patient able to swallow and handle all secretions.  Continue serial examinations   ??  Continue aggressive IV steroids given ongoing bronchospasm   ??  PPI while on steroids   ??  Schedule bronchodilators: duonebs q4h, pulmicort nebs 72m BID, and albuterol PRN   ??  At discharge, patient will need ICS/LABA/LAMA, advised Symbicort 160 and Spiriva HandiHaler.  Counseled patient to reapply for Medicaid   ??  Supplemental oxygen to maintain SpO2 >88%.  Use high flow nasal cannula while severely  dyspneic.  May consider heliox if dyspnea worsens   ??  Total IgE, regional allergy panel   ??  Please assess for home oxygen need prior to discharge   ??  Aggressive pulmonary toileting/bronchial hygiene   ??  Frequent incentive spirometry   ??  Aspiration precautions including elevating HOB >30deg   ??  PT/OT, OOB, ambulate with assistance as tolerated   ??  DVT ppx per primary service   ??  Counseled patient on avoidance of asthma triggers.  Counseled husband to quit smoking   ??  -I spent 13 minutes with patient regarding cessation of smoking blunts.  I reviewed health risks of tobacco use including increased risk of MI, stroke, cancer, etc.   We reviewed various approaches to cessation including pills, patches, inhaler, gum, weaning self, "cold [place]", and smoking cessation classes.  Patient agreeable, prescribed nicotine lozenges at discharge  if needed   ??  Counseled regarding marijuana cessation   ??  Will follow                Subjective:        Patient is a 26 y.o. female with a past medical history of asthma and marijuana dependence, presented to [facility] with acute onset of shortness of breath.  Patient reports that symptoms started a few hours prior to presentation.  Patient  reports that she was taking care of her sick  daughter, then developed symptoms of acute wheezing and shortness of breath, took nebulizer, slept, symptoms did not abate, took additional nebulizer treatment, no improvement, so patient presented to [facility] ([place]  While there, patient was treated for asthma exacerbation, placed on BiPAP, overnight developed neck swelling, this morning stopped, CT neck performed.  Patient receiving steroids and bronchodilators.  With regards to her asthma, patient  reports she was diagnosed around age 24, was on controller inhalers most recently Trelegy until she became pregnant, discontinued by her obstetrician (child is now 27 months old).  Patient reports that she only uses as needed albuterol about once a week  prior to becoming sick, however has nightly nocturnal awakenings.  Reports smoking 1 blunt once a week, marijuana.  Patient reports no occupational exposures to coal/silica/asbestos.  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   Patient denies nausea, vomiting, diarrhea, hemoptysis.           Past Medical History:        Diagnosis  Date         ?  Asthma           No past surgical history on file.      Prior to Admission medications             Medication  Sig  Start Date  End Date  Taking?  Authorizing Provider            albuterol (Proventil HFA) 90 mcg/actuation inhaler  Take 2 Puffs by inhalation every four (4) hours as needed for Wheezing.   Patient not taking: Reported on 02/10/2021  01/03/21      Glean Salvo, MD     albuterol (PROVENTIL VENTOLIN) 2.5 mg /3 mL (0.083 %) nebu  Take 2.5 mg by inhalation every four (4) hours as needed.  06/27/20      Other, Phys, MD     albuterol (PROVENTIL HFA, VENTOLIN HFA, PROAIR HFA) 90 mcg/actuation inhaler  Take 1-2 Puffs by inhalation.   Patient not taking: Reported on  02/10/2021  06/27/20      Other, Phys, MD     albuterol-ipratropium (DUO-NEB) 2.5 mg-0.5 mg/3 ml nebu  3 mL by Nebulization route every four (4) hours as needed for Wheezing.        Other,  Phys, MD     fluticasone/umeclidin/vilanter (TRELEGY ELLIPTA IN)  Take 2 Puffs by inhalation two (2) times a day. 263mg   Patient not taking: Reported on 02/10/2021        Other, Phys, MD            ipratropium (Atrovent HFA) 17 mcg/actuation inhaler  Take 1 Puff by inhalation every six (6) hours as needed for Wheezing.   Patient not taking: Reported on 02/10/2021  06/21/20      RShearon Balo PA-C          Allergies        Allergen  Reactions         ?  Sulfa (Sulfonamide Antibiotics)  Hives           Social History          Tobacco Use         ?  Smoking status:  Never     ?  Smokeless tobacco:  Never       Substance Use Topics         ?  Alcohol use:  Not Currently         No family history on file.         Current Facility-Administered Medications          Medication  Dose  Route  Frequency           ?  methylPREDNISolone (PF) (SOLU-MEDROL) injection 40 mg   40 mg  IntraVENous  Q12H     ?  insulin lispro (HUMALOG) injection     SubCUTAneous  AC&HS     ?  budesonide (PULMICORT) 1,000 mcg/2 mL nebulizer susp   1,000 mcg  Nebulization  BID RT     ?  albuterol/ipratropium (DUONEB) neb solution   1 Dose  Nebulization  Q4H RT     ?  cefTRIAXone (ROCEPHIN) 1 g in sterile water (preservative free) 10 mL IV syringe   1 g  IntraVENous  Q24H     ?  azithromycin (ZITHROMAX) 500 mg in 0.9% sodium chloride 250 mL (VIAL-MATE)   500 mg  IntraVENous  Q24H     ?  famotidine (PEPCID) tablet 20 mg   20 mg  Oral  BID     ?  sodium chloride (NS) flush 5-40 mL   5-40 mL  IntraVENous  Q8H     ?  enoxaparin (LOVENOX) injection 40 mg   40 mg  SubCUTAneous  DAILY           ?  montelukast (SINGULAIR) tablet 10 mg   10 mg  Oral  QHS           Review of Systems:   A comprehensive ROS was obtained as stated in HPI, all others are negative           Objective:     Vital Signs:       Visit Vitals   BP  114/63      Pulse  (!) 105      Temp  97.5 ??F (36.4 ??C)      Resp  23      Ht  5' 1"  (1.549 m)  Wt  87.1 kg (192 lb)      LMP  01/21/2021       SpO2  92%      Breastfeeding  Unknown      BMI  36.28 kg/m??                O2 Device: Nasal cannula     O2 Flow Rate (L/min): 6 l/min       No data recorded.           Intake/Output:    Last shift:      No intake/output data recorded.   Last 3 shifts: 01/29 0701 - 01/30 1900   In: 300 [I.V.:300]   Out: -       Intake/Output Summary (Last 24 hours) at 02/11/2021 1952   Last data filed at 02/10/2021 2210     Gross per 24 hour        Intake  50 ml        Output  --        Net  50 ml         Physical Exam:       General:   Alert, cooperative, no distress, appears stated age, obese, sitting up in ER gurney        Head:   Normocephalic, without obvious abnormality, atraumatic.     Eyes:   Conjunctivae/corneas clear.ANicteric, no ocular drainage        Nose:  Nares normal. Mucosa normal. No drainage or sinus tenderness.        Throat:  Lips, mucosa dry. NO thrush; good dentition, no oral lesions        Neck:  Supple, symmetrical, trachea midline, thick with crepitus on palpation, no adenopathy, thyroid: no enlargment/tenderness/nodules, no carotid bruit and no JVD.  No crepitus        Back:    Symmetric, no curvature, no spine tenderness or flank pain     Lungs:    Fair air entry throughout all lung fields, extensive wheezes throughout all lung fields along with coarse rhonchi throughout        Chest wall:   No tenderness or deformity. NO CREPITUS        Heart:   Regular rate and rhythm, S1, S2 normal, no murmur, click, rub or gallop.        Abdomen:    Soft, non-tender.Bowel sounds normal. No masses,  No organomegaly. No paradoxical motion        Extremities:  normal, atraumatic, no cyanosis or edema.  No clubbing        Pulses:  1-2+ and symmetric all extremities.        Skin:  Skin color, texture, turgor normal. No rashes or lesions     Lymph nodes:  Cervical, supraclavicular, and axillary nodes normal.        Neurologic:  Grossly nonfocal, strength and coordination sensation grossly intact throughout extremities                 Data review:     Labs:     Recent Results (from the past 24 hour(s))     TROPONIN-HIGH SENSITIVITY          Collection Time: 02/10/21 11:39 PM         Result  Value  Ref Range            Troponin-High Sensitivity  <3  0 - 54 ng/L       EKG, 12 LEAD,  INITIAL          Collection Time: 02/10/21 11:42 PM         Result  Value  Ref Range            Ventricular Rate  123  BPM       Atrial Rate  123  BPM       P-R Interval  130  ms       QRS Duration  84  ms       Q-T Interval  290  ms       QTC Calculation (Bezet)  415  ms       Calculated P Axis  62  degrees       Calculated R Axis  52  degrees       Calculated T Axis  28  degrees       Diagnosis                 Sinus tachycardia   Otherwise normal ECG   No previous ECGs available   Confirmed by Gerre Scull, MD, ----- ([id number]) on 02/11/2021 2:54:35 PM          METABOLIC PANEL, BASIC          Collection Time: 02/11/21  5:01 AM         Result  Value  Ref Range            Sodium  135 (L)  136 - 145 mmol/L       Potassium  4.9  3.5 - 5.5 mmol/L       Chloride  105  100 - 111 mmol/L       CO2  22  21 - 32 mmol/L       Anion gap  8  3.0 - 18 mmol/L       Glucose  122 (H)  74 - 99 mg/dL       BUN  9  7.0 - 18 MG/DL       Creatinine  0.81  0.6 - 1.3 MG/DL       BUN/Creatinine ratio  11 (L)  12 - 20         eGFR  >60  >60 ml/min/1.95m       Calcium  9.8  8.5 - 10.1 MG/DL       CBC W/O DIFF          Collection Time: 02/11/21  5:01 AM         Result  Value  Ref Range            WBC  17.2 (H)  4.6 - 13.2 K/uL       RBC  5.40 (H)  4.20 - 5.30 M/uL       HGB  12.9  12.0 - 16.0 g/dL       HCT  40.8  35.0 - 45.0 %       MCV  75.6 (L)  78.0 - 100.0 FL       MCH  23.9 (L)  24.0 - 34.0 PG       MCHC  31.6  31.0 - 37.0 g/dL       RDW  18.3 (H)  11.6 - 14.5 %       PLATELET  448 (H)  135 - 420 K/uL       MPV  9.2  9.2 - 11.8 FL       NRBC  0.0  0 PER  100 WBC       ABSOLUTE NRBC  0.00  0.00 - 0.01 K/uL       TROPONIN-HIGH SENSITIVITY          Collection Time: 02/11/21  5:01 AM          Result  Value  Ref Range            Troponin-High Sensitivity  <3  0 - 54 ng/L       HEMOGLOBIN A1C WITH EAG          Collection Time: 02/11/21  5:01 AM         Result  Value  Ref Range            Hemoglobin A1c  5.5  4.2 - 5.6 %       Est. average glucose  111  mg/dL       BLOOD GAS, ARTERIAL POC          Collection Time: 02/11/21  5:07 AM         Result  Value  Ref Range            Device:  BIPAP MASK          FIO2 (POC)  75  %       pH (POC)  7.38  7.35 - 7.45         pCO2 (POC)  37.4  35.0 - 45.0 MMHG       pO2 (POC)  125 (H)  80 - 100 MMHG       HCO3 (POC)  21.9 (L)  22 - 26 MMOL/L       sO2 (POC)  98.8 (H)  92 - 97 %       Base deficit (POC)  2.9  mmol/L       Mode  BILEVEL          Set Rate  8  bpm       PEEP/CPAP (POC)  8  cmH2O       PIP (POC)  19          Pressure support  10  cmH2O       Allens test (POC)  Positive          Total resp. rate  31          Site  RIGHT RADIAL          Specimen type (POC)  ARTERIAL               Performed by  Casilda Carls          CT Results (most recent):   Results from Hospital Encounter encounter on 02/10/21      CT NECK SOFT TISSUE W CONT      Narrative   EXAMINATION: CT soft tissue neck with contrast      INDICATION: Acute neck swelling, asthma exacerbation      COMPARISON: None      TECHNIQUE: CT of the neck with IV contrast. All CT scans at this facility are   performed using dose optimization technique as appropriate to a performed exam,   to include automated exposure control, adjustment of the mA and/or kV according   to patient size (including appropriate matching first site specific   examinations), or use of iterative reconstruction technique.      FINDINGS:      Airways/mucosa/tonsils: Adenoids and tonsils unremarkable. Airways appear   largely patent. No suspicious mucosal lesions clearly  identified.      Major salivary glands: Parotid glands and submandibular glands unremarkable.      Thyroid: Unremarkable.      Lymph nodes: No adenopathy by size  criteria.      Vessels: Major vessels unremarkable.      Soft tissues: Extensive patchy soft tissue emphysema is notable in the left   greater than right lower neck extending into anterior chest and large volume   incompletely imaged pneumomediastinum. Globes are round and symmetric. No   suspicious rim-enhancing abscess identified.      Other: Left apical bandlike opacities, and minimal right apical groundglass   density. Paranasal sinuses and mastoids well-aerated. Imaged lower brain   unremarkable.      Bones: No acute osseous findings.      Impression   Extensive soft tissue emphysema neck extending into the chest, including   pneumomediastinum, which is new compared to recent prior chest CT. In the   absence of interval trauma, alveolar rupture with extension into the   pneumomediastinum and neck is the primary diagnostic consideration. No definite   pneumothorax identified in the upper chest. Consider repeat CT of the chest at   some point if clinically warranted.      Bandlike opacities at the left apex, and groundglass density at right apex,   probably atelectasis. Recommend attention on follow-up imaging.        PFT Results  (Last 48 hours)             None                    Echo Results  (Last 48 hours)             None                  Imaging:   I have personally reviewed the patients radiographs and have reviewed the reports:   CT chest without contrast from 02/10/2021 shows mainly clear lung fields bilaterally with some mild linear atelectasis with pleural thickening laterally.  No masses, infiltrates, consolidations, effusions seen.  CT neck from 02/11/2021 shows extensive  subcutaneous emphysema in neck extending down into anterior and central mediastinum consistent with pneumomediastinum, causing some compressive atelectasis of left lung.   CXR Results  (Last 48  hours)                               02/10/21 1218    XR CHEST PORT  Final result        Impression:           No acute pulmonic disease.                             Narrative:    EXAM: CHEST  CPT CODE: 40102             CLINICAL INDICATION/HISTORY: Asthma attack; nebulizer treatment without relief.             COMPARISON: None.             TECHNIQUE: Single AP portable view of chest at 1213.              FINDINGS: There are clear lungs and sharp pleural margins.  The      cardiomediastinal silhouette is normal.  The bones and soft tissues are  unremarkable.                                   CT Results  (Last 48  hours)                               02/11/21 1702    CT NECK SOFT TISSUE W CONT  Final result        Impression:           Extensive soft tissue emphysema neck extending into the chest, including      pneumomediastinum, which is new compared to recent prior chest CT. In the      absence of interval trauma, alveolar rupture with extension into the      pneumomediastinum and neck is the primary diagnostic consideration. No definite      pneumothorax identified in the upper chest. Consider repeat CT of the chest at      some point if clinically warranted.             Bandlike opacities at the left apex, and groundglass density at right apex,      probably atelectasis. Recommend attention on follow-up imaging.              Narrative:    EXAMINATION: CT soft tissue neck with contrast             INDICATION: Acute neck swelling, asthma exacerbation             COMPARISON: None             TECHNIQUE: CT of the neck with IV contrast. All CT scans at this facility are      performed using dose optimization technique as appropriate to a performed exam,      to include automated exposure control, adjustment of the mA and/or kV according      to patient size (including appropriate matching first site specific      examinations), or use of iterative reconstruction technique.             FINDINGS:             Airways/mucosa/tonsils: Adenoids and tonsils unremarkable. Airways appear      largely patent. No suspicious mucosal lesions clearly identified.              Major salivary glands: Parotid glands and submandibular glands unremarkable.             Thyroid: Unremarkable.             Lymph nodes: No adenopathy by size criteria.             Vessels: Major vessels unremarkable.             Soft tissues: Extensive patchy soft tissue emphysema is notable in the left      greater than right lower neck extending into anterior chest and large volume      incompletely imaged pneumomediastinum. Globes are round and symmetric. No      suspicious rim-enhancing abscess identified.             Other: Left apical bandlike opacities, and minimal right apical groundglass      density. Paranasal sinuses and mastoids well-aerated. Imaged lower brain      unremarkable.  Bones: No acute osseous findings.                     02/10/21 1522    CT CHEST WO CONT  Final result        Impression:           Minimal bronchitis/reactive airways with perhaps tiny areas of distal      endobronchial mucus/infiltrate. No confluent consolidation.             Borderline prominent precarinal node, nonspecific.              Narrative:    EXAMINATION: CT chest without contrast             INDICATION: Shortness of breath, asthma             COMPARISON: None             TECHNIQUE: CT chest without contrast. All CT scans at this facility are      performed using dose optimization technique as appropriate to a performed exam,      to include automated exposure control, adjustment of the mA and/or kV according      to patient size (including appropriate matching first site specific      examinations), or use of iterative reconstruction technique.             FINDINGS:             Evaluation of soft tissues and vessels limited without vascular enhancement.             Cardiovascular: Major vessels normal in course and caliber. Heart size normal.             Mediastinum: Imaged thyroid unremarkable. Borderline prominent precarinal node.      Esophagus nondistended.             Pleura: No pleural  effusion. No pneumothorax.             Lungs/airways: No suspicious focal bronchial lesions. Minimal bronchial wall      thickening perhaps tiny areas of distal endobronchial mucus/infiltrate. No      confluent consolidation. Lingular likely streaky atelectasis.             Upper abdomen: No acute findings.             Miscellaneous: Superficial soft tissues unremarkable.             Bones: No acute osseous findings.                                         High complexity decision making was performed during the evaluation of this patient at high risk for decompensation with multiple organ involvement       Above mentioned total time spent on reviewing the case/medical record/data/notes/EMR/patient examination/documentation/coordinating care with nurse/consultants, exclusive of procedures with complex decision making performed and > 50% time spent in face  to face evaluation.                   Mellody Memos  MD/MPH      Pulmonary, Critical Care Medicine   [facility]

## 2021-02-11 NOTE — ED Notes (Signed)
Assumed care of pt. Currently resting quietly in no distress, sitting on stretcher with family member at bedside. Assistance offered, no needs at this time.

## 2021-02-11 NOTE — ED Notes (Signed)
Pt rested well through out shift.   RT came down to readjust pt mask and setting.  No complaints was voiced.  Pt pure wick in place and working properly.  Vitals been WNL.   Denies any chest pain/tightness.

## 2021-02-11 NOTE — Progress Notes (Signed)
Progress Notes by Hebert Soho, NP at 02/11/21 1154                Author: Hebert Soho, NP  Service: Hospitalist  Author Type: Nurse Practitioner       Filed: 02/11/21 1549  Date of Service: 02/11/21 1154  Status: Attested Addendum          Editor: Hebert Soho, NP (Nurse Practitioner)       Related Notes: Original Note by Hebert Soho, NP (Nurse Practitioner) filed at 02/11/21  1207          Cosigner: Amparo Bristol, MD at 02/11/21 1714          Attestation signed by Amparo Bristol, MD at 02/11/21 1714          Patient seen and examined independently and I agree with the assessment plan per NP [facility].  Patient reports that when she takes the BiPAP off, she  feels pressure in her chest.  She notes neck swelling which has started acutely over the past day, she states it feels tight and painful.  Wheezing is improving.  Reports she knows what her triggers are, including illness, dust, strong smells.  She states  she smokes marijuana, she intends to stop.  Her husband smokes, he has a separate room where he goes to smoke.      Patient removed from BiPAP per RT, she dropped to 89 to 90% on room air and was subsequently placed on nasal cannula.  Pulmonology consult.  CT neck with contrast to evaluate swelling.                              [facility]ist [facility]   Progress Note      Patient: Stephanie Murray Age:  26 y.o. DOB: May 04, 1995 MR#:  [id number] SSN: [id number]   Date: 02/11/2021         Subjective:     Alert and oriented x4. On bipap.            Assessment/Plan:     1. Asthma exacerbation with +Rhinovirus and possible superimposed bacterial PNA. Pulmonology consult and appreciate assistance.    2. Tachycardia secondary to duoneb treatments    3. Leukocytosis secondary to URI v Steroid use    4. Hyponatremia    5. Hyperglycemia secondary to steroid use vs infection vs DM   6. Obesity BMI 36.28 kg/m2       Plan   1.  Continue duoneb, brovana, pulmicort, azithromycin, ceftriaxone, steroids, and singulair. Pro calcitonin normal   2.  Monitor oxygen demand and wean. Currently on bipap    3.  A1c ordered and pending. A1c 5.5%   4.  NS IVF x 1 bag   5.  Monitor metabolic panel and renal function    6.  Patient is alert and oriented x4 and of sound mind to update family.       30 minutes in total spent today in preparation for visit, review of external notes and test results, review of test results, order placement, obtaining history, physical exam  of the patient, and/or counseling the patient concerning diagnosis, test results, treatment plan and speaking with physicians and nurses involved in patient's care. Additional time spent in review and independent interpretation of external notes, imaging,  labs via hospital EMR and/or Care Everywhere, as well as pre-charting activity all of which occur on the day of  service.      10 minutes were spent in Preparation to see Patient and Obtaining and Reviewing Separate History   5 minutes were spent in Examination, Counseling, & Educating Patient   5 minutes were spent (if any) in [place] with Nursing Staff and Physicians   10 minutes were spent Documenting in EHR and Interpreting Tests Independently      A Total of 30 minutes were spent seeing this Patient.        Case discussed with:  [x]  Patient  [] Family  [x] Nursing  []  Case Management   DVT Prophylaxis:  [x]  Lovenox  []  Hep SQ  []  SCDs  []  Coumadin   []  On Heparin gtt        Objective:     VS: Visit Vitals      BP  114/63     Pulse  (!) 121     Temp  97.5 ??F (36.4 ??C)     Resp  29     Ht  5' 1"  (1.549 m)     Wt  87.1 kg (192 lb)     LMP  01/21/2021     SpO2  96%     Breastfeeding  Unknown        BMI  36.28 kg/m??         Tmax/24hrs: Temp (24hrs), Avg:97.8 ??F (36.6 ??C), Min:97.5 ??F (36.4 ??C), Max:98.1 ??F (36.7 ??C)      Intake/Output Summary (Last 24 hours) at 02/11/2021 1155   Last data filed at 02/10/2021 2210      Gross per 24 hour        Intake  300 ml        Output  --        Net  300 ml        General:         Alert, cooperative, no acute distress, obese      HEENT:           NC, Atraumatic.  PERRLA, anicteric sclerae.   Lungs:            +wheeze t/o    Heart:              RRR, No murmur, No Rubs, No Gallops   Abdomen:      Soft, Non distended, Non tender.  +Bowel sounds, no HSM   Extremities:   No c/c/e   Psych:              Good insight. Not anxious or agitated.   Neurologic:     Alert and oriented X 4.  No acute neurological deficits       Labs:       Recent Results (from the past 24 hour(s))     INFLUENZA A & B AG (RAPID TEST)          Collection Time: 02/10/21 12:15 PM         Result  Value  Ref Range            Influenza A Antigen  Negative  NEG         Influenza B Antigen  Negative  NEG         COVID-19 RAPID TEST          Collection Time: 02/10/21 12:15 PM         Result  Value  Ref Range            Specimen source  Nasopharyngeal  COVID-19 rapid test  Not detected          CBC WITH AUTOMATED DIFF          Collection Time: 02/10/21  1:30 PM         Result  Value  Ref Range            WBC  7.5  4.6 - 13.2 K/uL       RBC  4.80  4.20 - 5.30 M/uL       HGB  11.5 (L)  12.0 - 16.0 g/dL       HCT  36.4  35.0 - 45.0 %       MCV  75.8 (L)  78.0 - 100.0 FL       MCH  24.0  24.0 - 34.0 PG       MCHC  31.6  31.0 - 37.0 g/dL       RDW  17.8 (H)  11.6 - 14.5 %       PLATELET  394  135 - 420 K/uL       MPV  9.2  9.2 - 11.8 FL       NRBC  0.0  0 PER 100 WBC       ABSOLUTE NRBC  0.00  0.00 - 0.01 K/uL       NEUTROPHILS  66  40 - 73 %       LYMPHOCYTES  25  21 - 52 %       MONOCYTES  4  3 - 10 %       EOSINOPHILS  4  0 - 5 %       BASOPHILS  0  0 - 2 %       IMMATURE GRANULOCYTES  0  0.0 - 0.5 %       ABS. NEUTROPHILS  5.0  1.8 - 8.0 K/UL       ABS. LYMPHOCYTES  1.9  0.9 - 3.6 K/UL       ABS. MONOCYTES  0.3  0.05 - 1.2 K/UL       ABS. EOSINOPHILS  0.3  0.0 - 0.4 K/UL       ABS. BASOPHILS  0.0  0.0 - 0.1 K/UL       ABS. IMM.  GRANS.  0.0  0.00 - 0.04 K/UL       DF  AUTOMATED          METABOLIC PANEL, COMPREHENSIVE          Collection Time: 02/10/21  1:30 PM         Result  Value  Ref Range            Sodium  140  136 - 145 mmol/L       Potassium  3.7  3.5 - 5.5 mmol/L       Chloride  108  100 - 111 mmol/L       CO2  25  21 - 32 mmol/L       Anion gap  7  3.0 - 18 mmol/L       Glucose  80  74 - 99 mg/dL       BUN  9  7.0 - 18 MG/DL       Creatinine  0.82  0.6 - 1.3 MG/DL       BUN/Creatinine ratio  11 (L)  12 - 20         eGFR  >60  >60 ml/min/1.22m  Calcium  8.9  8.5 - 10.1 MG/DL       Bilirubin, total  0.2  0.2 - 1.0 MG/DL       ALT (SGPT)  19  13 - 56 U/L       AST (SGOT)  14  10 - 38 U/L       Alk. phosphatase  99  45 - 117 U/L       Protein, total  7.4  6.4 - 8.2 g/dL       Albumin  3.6  3.4 - 5.0 g/dL       Globulin  3.8  2.0 - 4.0 g/dL       A-G Ratio  0.9  0.8 - 1.7         NT-PRO BNP          Collection Time: 02/10/21  1:30 PM         Result  Value  Ref Range            NT pro-BNP  6  0 - 450 PG/ML       TROPONIN-HIGH SENSITIVITY          Collection Time: 02/10/21  1:30 PM         Result  Value  Ref Range            Troponin-High Sensitivity  <3  0 - 54 ng/L       D DIMER          Collection Time: 02/10/21  1:30 PM         Result  Value  Ref Range            D DIMER  0.42  <0.46 ug/ml(FEU)       HCG QL SERUM          Collection Time: 02/10/21  1:30 PM         Result  Value  Ref Range            HCG, Ql.  Negative  NEG         PROCALCITONIN          Collection Time: 02/10/21  1:30 PM         Result  Value  Ref Range            Procalcitonin  <0.05  ng/mL       RESPIRATORY VIRUS PANEL W/COVID-19, PCR          Collection Time: 02/10/21  3:11 PM       Specimen: Nasopharyngeal         Result  Value  Ref Range            Adenovirus  Not detected  NOTD         Coronavirus 229E  Not detected  NOTD         Coronavirus HKU1  Not detected  NOTD         Coronavirus CVNL63  Not detected  NOTD         Coronavirus OC43  Not detected  NOTD          SARS-CoV-2, PCR  Not detected  NOTD         Metapneumovirus  Not detected  NOTD         Rhinovirus and Enterovirus  Detected (A)  NOTD         Influenza A  Not detected  NOTD         Influenza  B  Not detected  NOTD         Parainfluenza 1  Not detected  NOTD         Parainfluenza 2  Not detected  NOTD         Parainfluenza 3  Not detected  NOTD         Parainfluenza virus 4  Not detected  NOTD         RSV by PCR  Not detected  NOTD         B. parapertussis, PCR  Not detected  NOTD         Bordetella pertussis - PCR  Not detected  NOTD         Chlamydophila pneumoniae DNA, QL, PCR  Not detected  NOTD         Mycoplasma pneumoniae DNA, QL, PCR  Not detected  NOTD         CULTURE, BLOOD          Collection Time: 02/10/21  7:30 PM       Specimen: Blood         Result  Value  Ref Range            Special Requests:  NO SPECIAL REQUESTS          Culture result:  NO GROWTH AFTER 11 HOURS          CULTURE, BLOOD          Collection Time: 02/10/21  7:30 PM       Specimen: Blood         Result  Value  Ref Range            Special Requests:  NO SPECIAL REQUESTS          Culture result:  NO GROWTH AFTER 11 HOURS          TROPONIN-HIGH SENSITIVITY          Collection Time: 02/10/21 11:39 PM         Result  Value  Ref Range            Troponin-High Sensitivity  <3  0 - 54 ng/L       EKG, 12 LEAD, INITIAL          Collection Time: 02/10/21 11:42 PM         Result  Value  Ref Range            Ventricular Rate  123  BPM       Atrial Rate  123  BPM       P-R Interval  130  ms       QRS Duration  84  ms       Q-T Interval  290  ms       QTC Calculation (Bezet)  415  ms       Calculated P Axis  62  degrees       Calculated R Axis  52  degrees       Calculated T Axis  28  degrees       Diagnosis                 Sinus tachycardia   Otherwise normal ECG   No previous ECGs available          METABOLIC PANEL, BASIC          Collection Time: 02/11/21  5:01 AM  Result  Value  Ref Range            Sodium  135 (L)  136 - 145  mmol/L       Potassium  4.9  3.5 - 5.5 mmol/L       Chloride  105  100 - 111 mmol/L       CO2  22  21 - 32 mmol/L       Anion gap  8  3.0 - 18 mmol/L       Glucose  122 (H)  74 - 99 mg/dL       BUN  9  7.0 - 18 MG/DL       Creatinine  0.81  0.6 - 1.3 MG/DL       BUN/Creatinine ratio  11 (L)  12 - 20         eGFR  >60  >60 ml/min/1.78m       Calcium  9.8  8.5 - 10.1 MG/DL       CBC W/O DIFF          Collection Time: 02/11/21  5:01 AM         Result  Value  Ref Range            WBC  17.2 (H)  4.6 - 13.2 K/uL       RBC  5.40 (H)  4.20 - 5.30 M/uL       HGB  12.9  12.0 - 16.0 g/dL       HCT  40.8  35.0 - 45.0 %       MCV  75.6 (L)  78.0 - 100.0 FL       MCH  23.9 (L)  24.0 - 34.0 PG       MCHC  31.6  31.0 - 37.0 g/dL       RDW  18.3 (H)  11.6 - 14.5 %       PLATELET  448 (H)  135 - 420 K/uL       MPV  9.2  9.2 - 11.8 FL       NRBC  0.0  0 PER 100 WBC       ABSOLUTE NRBC  0.00  0.00 - 0.01 K/uL       TROPONIN-HIGH SENSITIVITY          Collection Time: 02/11/21  5:01 AM         Result  Value  Ref Range            Troponin-High Sensitivity  <3  0 - 54 ng/L       BLOOD GAS, ARTERIAL POC          Collection Time: 02/11/21  5:07 AM         Result  Value  Ref Range            Device:  BIPAP MASK          FIO2 (POC)  75  %       pH (POC)  7.38  7.35 - 7.45         pCO2 (POC)  37.4  35.0 - 45.0 MMHG       pO2 (POC)  125 (H)  80 - 100 MMHG       HCO3 (POC)  21.9 (L)  22 - 26 MMOL/L       sO2 (POC)  98.8 (H)  92 - 97 %       Base  deficit (POC)  2.9  mmol/L       Mode  BILEVEL          Set Rate  8  bpm       PEEP/CPAP (POC)  8  cmH2O       PIP (POC)  19          Pressure support  10  cmH2O       Allens test (POC)  Positive          Total resp. rate  31          Site  RIGHT RADIAL          Specimen type (POC)  ARTERIAL               Performed by  Casilda Carls                Signed By:  Hebert Soho, NP           February 11, 2021

## 2021-02-11 NOTE — Progress Notes (Signed)
[facility] Hospitalist Group  Progress Note    Patient: Stephanie Murray Age: 26 y.o. DOB: 1995/11/30 MR#: [id number] SSN: [id number]  Date: 02/11/2021     Subjective:   26  y/o female  presented to the ED 02/10/2021 with SOB. According to the pt it began yesterday.  She has a hx of asthma.     Pt  is currently on bi-pap. She stated she was unable to come off bi-pap due to her "working to hard" to breath.    Pt was able to answer all questions asked of her. She was A&Ox4.       Assessment/Plan:   1. Astnma exacerbation w/rhinovirus and enterovirus with possible CAP  2.Tachycardia due to nebulizer treatment  3. Obesity  5. Hyponatremia    Plan:  Continue bi-pap support until able to wean to NC to room air to prevent respiratory distress  Continue with guaifenesin to help with cough   Continue azithromycin and  ceftriaxone for CAP  Continue with Solu-Medrol q 6, DuoNebs q4, budesonide and arformoterol q 12, along with singular nightly for respiratory treatment  Pulmonary  consult will be placed  Nutrition/Dietary eduction    Case discussed with:  [x] Patient  [] Family  [] Nursing  [] Case Management  DVT Prophylaxis:  [x] Lovenox  [] Hep SQ  [] SCDs  [] Coumadin   [] On Heparin gtt    Objective:   VS: Visit Vitals  BP 114/63   Pulse (!) 121   Temp 97.5 ??F (36.4 ??C)   Resp 29   Ht 5' 1"  (1.549 m)   Wt 87.1 kg (192 lb)   LMP 01/21/2021   SpO2 96%   Breastfeeding Unknown   BMI 36.28 kg/m??      Tmax/24hrs: Temp (24hrs), Avg:97.8 ??F (36.6 ??C), Min:97.5 ??F (36.4 ??C), Max:98.1 ??F (36.7 ??C)    Intake/Output Summary (Last 24 hours) at 02/11/2021 1133  Last data filed at 02/10/2021 2210  Gross per 24 hour   Intake 300 ml   Output --   Net 300 ml       General:  Alert, NAD  Cardiovascular:  ST  Pulmonary: Expiratory wheezing, on Bi-pap  GI:  +BS in all four quadrants, soft, non-tender  Extremities:  No edema; 2+ dorsalis pedis pulses bilaterally  Neuro: A&OX4    Family contact: [employer]:    Recent  Results (from the past 24 hour(s))   INFLUENZA A & B AG (RAPID TEST)    Collection Time: 02/10/21 12:15 PM   Result Value Ref Range    Influenza A Antigen Negative NEG      Influenza B Antigen Negative NEG     COVID-19 RAPID TEST    Collection Time: 02/10/21 12:15 PM   Result Value Ref Range    Specimen source Nasopharyngeal      COVID-19 rapid test Not detected     CBC WITH AUTOMATED DIFF    Collection Time: 02/10/21  1:30 PM   Result Value Ref Range    WBC 7.5 4.6 - 13.2 K/uL    RBC 4.80 4.20 - 5.30 M/uL    HGB 11.5 (L) 12.0 - 16.0 g/dL    HCT 36.4 35.0 - 45.0 %    MCV 75.8 (L) 78.0 - 100.0 FL    MCH 24.0 24.0 - 34.0 PG    MCHC 31.6 31.0 - 37.0 g/dL    RDW 17.8 (H) 11.6 - 14.5 %    PLATELET 394 135 - 420 K/uL  MPV 9.2 9.2 - 11.8 FL    NRBC 0.0 0 PER 100 WBC    ABSOLUTE NRBC 0.00 0.00 - 0.01 K/uL    NEUTROPHILS 66 40 - 73 %    LYMPHOCYTES 25 21 - 52 %    MONOCYTES 4 3 - 10 %    EOSINOPHILS 4 0 - 5 %    BASOPHILS 0 0 - 2 %    IMMATURE GRANULOCYTES 0 0.0 - 0.5 %    ABS. NEUTROPHILS 5.0 1.8 - 8.0 K/UL    ABS. LYMPHOCYTES 1.9 0.9 - 3.6 K/UL    ABS. MONOCYTES 0.3 0.05 - 1.2 K/UL    ABS. EOSINOPHILS 0.3 0.0 - 0.4 K/UL    ABS. BASOPHILS 0.0 0.0 - 0.1 K/UL    ABS. IMM. GRANS. 0.0 0.00 - 0.04 K/UL    DF AUTOMATED     METABOLIC PANEL, COMPREHENSIVE    Collection Time: 02/10/21  1:30 PM   Result Value Ref Range    Sodium 140 136 - 145 mmol/L    Potassium 3.7 3.5 - 5.5 mmol/L    Chloride 108 100 - 111 mmol/L    CO2 25 21 - 32 mmol/L    Anion gap 7 3.0 - 18 mmol/L    Glucose 80 74 - 99 mg/dL    BUN 9 7.0 - 18 MG/DL    Creatinine 0.82 0.6 - 1.3 MG/DL    BUN/Creatinine ratio 11 (L) 12 - 20      eGFR >60 >60 ml/min/1.54m    Calcium 8.9 8.5 - 10.1 MG/DL    Bilirubin, total 0.2 0.2 - 1.0 MG/DL    ALT (SGPT) 19 13 - 56 U/L    AST (SGOT) 14 10 - 38 U/L    Alk. phosphatase 99 45 - 117 U/L    Protein, total 7.4 6.4 - 8.2 g/dL    Albumin 3.6 3.4 - 5.0 g/dL    Globulin 3.8 2.0 - 4.0 g/dL    A-G Ratio 0.9 0.8 - 1.7     NT-PRO BNP     Collection Time: 02/10/21  1:30 PM   Result Value Ref Range    NT pro-BNP 6 0 - 450 PG/ML   TROPONIN-HIGH SENSITIVITY    Collection Time: 02/10/21  1:30 PM   Result Value Ref Range    Troponin-High Sensitivity <3 0 - 54 ng/L   D DIMER    Collection Time: 02/10/21  1:30 PM   Result Value Ref Range    D DIMER 0.42 <0.46 ug/ml(FEU)   HCG QL SERUM    Collection Time: 02/10/21  1:30 PM   Result Value Ref Range    HCG, Ql. Negative NEG     PROCALCITONIN    Collection Time: 02/10/21  1:30 PM   Result Value Ref Range    Procalcitonin <0.05 ng/mL   RESPIRATORY VIRUS PANEL W/COVID-19, PCR    Collection Time: 02/10/21  3:11 PM    Specimen: Nasopharyngeal   Result Value Ref Range    Adenovirus Not detected NOTD      Coronavirus 229E Not detected NOTD      Coronavirus HKU1 Not detected NOTD      Coronavirus CVNL63 Not detected NOTD      Coronavirus OC43 Not detected NOTD      SARS-CoV-2, PCR Not detected NOTD      Metapneumovirus Not detected NOTD      Rhinovirus and Enterovirus Detected (A) NOTD      Influenza A Not detected  NOTD      Influenza B Not detected NOTD      Parainfluenza 1 Not detected NOTD      Parainfluenza 2 Not detected NOTD      Parainfluenza 3 Not detected NOTD      Parainfluenza virus 4 Not detected NOTD      RSV by PCR Not detected NOTD      B. parapertussis, PCR Not detected NOTD      Bordetella pertussis - PCR Not detected NOTD      Chlamydophila pneumoniae DNA, QL, PCR Not detected NOTD      Mycoplasma pneumoniae DNA, QL, PCR Not detected NOTD     CULTURE, BLOOD    Collection Time: 02/10/21  7:30 PM    Specimen: Blood   Result Value Ref Range    Special Requests: NO SPECIAL REQUESTS      Culture result: NO GROWTH AFTER 11 HOURS     CULTURE, BLOOD    Collection Time: 02/10/21  7:30 PM    Specimen: Blood   Result Value Ref Range    Special Requests: NO SPECIAL REQUESTS      Culture result: NO GROWTH AFTER 11 HOURS     TROPONIN-HIGH SENSITIVITY    Collection Time: 02/10/21 11:39 PM   Result Value Ref Range     Troponin-High Sensitivity <3 0 - 54 ng/L   EKG, 12 LEAD, INITIAL    Collection Time: 02/10/21 11:42 PM   Result Value Ref Range    Ventricular Rate 123 BPM    Atrial Rate 123 BPM    P-R Interval 130 ms    QRS Duration 84 ms    Q-T Interval 290 ms    QTC Calculation (Bezet) 415 ms    Calculated P Axis 62 degrees    Calculated R Axis 52 degrees    Calculated T Axis 28 degrees    Diagnosis       Sinus tachycardia  Otherwise normal ECG  No previous ECGs available     METABOLIC PANEL, BASIC    Collection Time: 02/11/21  5:01 AM   Result Value Ref Range    Sodium 135 (L) 136 - 145 mmol/L    Potassium 4.9 3.5 - 5.5 mmol/L    Chloride 105 100 - 111 mmol/L    CO2 22 21 - 32 mmol/L    Anion gap 8 3.0 - 18 mmol/L    Glucose 122 (H) 74 - 99 mg/dL    BUN 9 7.0 - 18 MG/DL    Creatinine 0.81 0.6 - 1.3 MG/DL    BUN/Creatinine ratio 11 (L) 12 - 20      eGFR >60 >60 ml/min/1.56m    Calcium 9.8 8.5 - 10.1 MG/DL   CBC W/O DIFF    Collection Time: 02/11/21  5:01 AM   Result Value Ref Range    WBC 17.2 (H) 4.6 - 13.2 K/uL    RBC 5.40 (H) 4.20 - 5.30 M/uL    HGB 12.9 12.0 - 16.0 g/dL    HCT 40.8 35.0 - 45.0 %    MCV 75.6 (L) 78.0 - 100.0 FL    MCH 23.9 (L) 24.0 - 34.0 PG    MCHC 31.6 31.0 - 37.0 g/dL    RDW 18.3 (H) 11.6 - 14.5 %    PLATELET 448 (H) 135 - 420 K/uL    MPV 9.2 9.2 - 11.8 FL    NRBC 0.0 0 PER 100 WBC    ABSOLUTE NRBC 0.00 0.00 -  0.01 K/uL   TROPONIN-HIGH SENSITIVITY    Collection Time: 02/11/21  5:01 AM   Result Value Ref Range    Troponin-High Sensitivity <3 0 - 54 ng/L   BLOOD GAS, ARTERIAL POC    Collection Time: 02/11/21  5:07 AM   Result Value Ref Range    Device: BIPAP MASK      FIO2 (POC) 75 %    pH (POC) 7.38 7.35 - 7.45      pCO2 (POC) 37.4 35.0 - 45.0 MMHG    pO2 (POC) 125 (H) 80 - 100 MMHG    HCO3 (POC) 21.9 (L) 22 - 26 MMOL/L    sO2 (POC) 98.8 (H) 92 - 97 %    Base deficit (POC) 2.9 mmol/L    Mode BILEVEL      Set Rate 8 bpm    PEEP/CPAP (POC) 8 cmH2O    PIP (POC) 19      Pressure support 10 cmH2O    Allens  test (POC) Positive      Total resp. rate 31      Site RIGHT RADIAL      Specimen type (POC) ARTERIAL      Performed by Casilda Carls        Signed By: Adelene Amas     February 11, 2021

## 2021-02-11 NOTE — ED Notes (Signed)
Changed sheets. Pt cleaned and given new gown

## 2021-02-11 NOTE — ED Notes (Signed)
Pt attempted to eat and became short of breath.

## 2021-02-11 NOTE — Progress Notes (Signed)
Chaplain completed the initial Spiritual Assessment of the patient, and offered Pastoral Care support to the patient.There is no advance directive present. Patient does not have any religious/cultural needs that will affect patient's preferences in health care. Chaplains will continue to follow and will provide pastoral care on an as needed/requested basis.    Stephanie Murray  Spiritual Care Department  [phone]

## 2021-02-12 LAB — BASIC METABOLIC PANEL
Anion Gap: 4 mmol/L (ref 3.0–18)
BUN: 17 MG/DL (ref 7.0–18)
Bun/Cre Ratio: 21 — ABNORMAL HIGH (ref 12–20)
CO2: 26 mmol/L (ref 21–32)
Calcium: 9.5 MG/DL (ref 8.5–10.1)
Chloride: 105 mmol/L (ref 100–111)
Creatinine: 0.8 MG/DL (ref 0.6–1.3)
ESTIMATED GLOMERULAR FILTRATION RATE: >60 mL/min/{1.73_m2} (ref >60)
Glucose: 121 mg/dL — ABNORMAL HIGH (ref 74–99)
Potassium: 4.9 mmol/L (ref 3.5–5.5)
Sodium: 135 mmol/L — ABNORMAL LOW (ref 136–145)

## 2021-02-12 LAB — CBC
Hematocrit: 36.3 % (ref 35.0–45.0)
Hemoglobin: 11.7 g/dL — ABNORMAL LOW (ref 12.0–16.0)
MCH: 24 PG (ref 24.0–34.0)
MCHC: 32.2 g/dL (ref 31.0–37.0)
MCV: 74.5 FL — ABNORMAL LOW (ref 78.0–100.0)
MPV: 9.9 FL (ref 9.2–11.8)
NRBC Absolute: 0 10*3/uL (ref 0.00–0.01)
Nucleated RBCs: 0 PER 100 WBC
Platelets: 408 10*3/uL (ref 135–420)
RBC: 4.87 M/uL (ref 4.20–5.30)
RDW: 18.1 % — ABNORMAL HIGH (ref 11.6–14.5)
WBC: 15.4 10*3/uL — ABNORMAL HIGH (ref 4.6–13.2)

## 2021-02-12 LAB — POCT GLUCOSE
POC Glucose: 123 mg/dL — ABNORMAL HIGH (ref 70–110)
POC Glucose: 120 mg/dL — ABNORMAL HIGH (ref 70–110)
POC Glucose: 128 mg/dL — ABNORMAL HIGH (ref 70–110)

## 2021-02-12 LAB — MAGNESIUM
Magnesium: 2.5 mg/dL (ref 1.6–2.6)
Magnesium: 2.5 mg/dL (ref 1.6–2.6)

## 2021-02-12 LAB — PHOSPHORUS
Phosphorus: 3.6 MG/DL (ref 2.5–4.9)
Phosphorus: 3.6 MG/DL (ref 2.5–4.9)

## 2021-02-12 LAB — METABOLIC PANEL, BASIC
Anion gap: 4 mmol/L (ref 3.0–18)
BUN/Creatinine ratio: 21 — ABNORMAL HIGH (ref 12–20)
BUN: 17 MG/DL (ref 7.0–18)
CO2: 26 mmol/L (ref 21–32)
Calcium: 9.5 MG/DL (ref 8.5–10.1)
Chloride: 105 mmol/L (ref 100–111)
Creatinine: 0.8 MG/DL (ref 0.6–1.3)
Glucose: 121 mg/dL — ABNORMAL HIGH (ref 74–99)
Potassium: 4.9 mmol/L (ref 3.5–5.5)
Sodium: 135 mmol/L — ABNORMAL LOW (ref 136–145)
eGFR: >60 mL/min/{1.73_m2} (ref >60)

## 2021-02-12 LAB — CBC W/O DIFF
ABSOLUTE NRBC: 0 10*3/uL (ref 0.00–0.01)
HCT: 36.3 % (ref 35.0–45.0)
HGB: 11.7 g/dL — ABNORMAL LOW (ref 12.0–16.0)
MCH: 24 PG (ref 24.0–34.0)
MCHC: 32.2 g/dL (ref 31.0–37.0)
MCV: 74.5 FL — ABNORMAL LOW (ref 78.0–100.0)
MPV: 9.9 FL (ref 9.2–11.8)
NRBC: 0 PER 100 WBC
PLATELET: 408 10*3/uL (ref 135–420)
RBC: 4.87 M/uL (ref 4.20–5.30)
RDW: 18.1 % — ABNORMAL HIGH (ref 11.6–14.5)
WBC: 15.4 10*3/uL — ABNORMAL HIGH (ref 4.6–13.2)

## 2021-02-12 LAB — GLUCOSE, POC
Glucose (POC): 123 mg/dL — ABNORMAL HIGH (ref 70–110)
Glucose (POC): 120 mg/dL — ABNORMAL HIGH (ref 70–110)
Glucose (POC): 128 mg/dL — ABNORMAL HIGH (ref 70–110)

## 2021-02-12 MED ORDER — MAGNESIUM SULFATE 4 GRAM/100 ML IV PIGGY BACK
4 gram/100 mL ( %) | Freq: Once | INTRAVENOUS | Status: AC
Start: 2021-02-12 — End: 2021-02-12
  Administered 2021-02-12: 07:00:00 via INTRAVENOUS

## 2021-02-12 MED ORDER — SODIUM CHLORIDE 0.9 % INJECTION
40 mg | Freq: Every day | INTRAMUSCULAR | Status: DC
Start: 2021-02-12 — End: 2021-02-14
  Administered 2021-02-12 – 2021-02-14 (×3): via INTRAVENOUS

## 2021-02-12 MED ORDER — METHYLPREDNISOLONE (PF) 40 MG/ML IJ SOLR
40 mg/mL | Freq: Two times a day (BID) | INTRAMUSCULAR | Status: DC
Start: 2021-02-12 — End: 2021-02-13
  Administered 2021-02-13 (×2): via INTRAVENOUS

## 2021-02-12 MED ORDER — METHYLPREDNISOLONE (PF) 40 MG/ML IJ SOLR
40 mg/mL | Freq: Three times a day (TID) | INTRAMUSCULAR | Status: DC
Start: 2021-02-12 — End: 2021-02-12
  Administered 2021-02-12 (×3): via INTRAVENOUS

## 2021-02-12 MED FILL — MAGNESIUM SULFATE 4 GRAM/100 ML IV PIGGY BACK: 4 gram/100 mL ( %) | INTRAVENOUS | Qty: 100

## 2021-02-12 MED FILL — AZITHROMYCIN 500 MG IV SOLUTION: 500 mg | INTRAVENOUS | Qty: 5

## 2021-02-12 MED FILL — MONTELUKAST 10 MG TAB: 10 mg | ORAL | Qty: 1

## 2021-02-12 MED FILL — BUDESONIDE 1 MG/2 ML NEB SUSPENSION: 1 mg/2 mL | RESPIRATORY_TRACT | Qty: 1

## 2021-02-12 MED FILL — ALBUTEROL SULFATE 0.083 % (0.83 MG/ML) SOLN FOR INHALATION: 2.5 mg /3 mL (0.083 %) | RESPIRATORY_TRACT | Qty: 1

## 2021-02-12 MED FILL — SOLU-MEDROL (PF) 40 MG/ML SOLUTION FOR INJECTION: 40 mg/mL | INTRAMUSCULAR | Qty: 1

## 2021-02-12 MED FILL — CEFTRIAXONE 1 GRAM SOLUTION FOR INJECTION: 1 gram | INTRAMUSCULAR | Qty: 1

## 2021-02-12 MED FILL — PANTOPRAZOLE 40 MG IV SOLR: 40 mg | INTRAVENOUS | Qty: 40

## 2021-02-12 MED FILL — LOVENOX 40 MG/0.4 ML SUBCUTANEOUS SYRINGE: 40 mg/0.4 mL | SUBCUTANEOUS | Qty: 0.4

## 2021-02-12 NOTE — ED Notes (Signed)
Pt  in bed on her computer. Gown, wipes, and new purwick provided as requested. Pt has no further request or complaints.

## 2021-02-12 NOTE — Consults (Signed)
Consults by Mellody Memos, MD at 02/12/21 1654                Author: Mellody Memos, MD  Service: Pulmonary Disease  Author Type: Physician       Filed: 02/13/21 0445  Date of Service: 02/12/21 1654  Status: Signed          Editor: Mellody Memos, MD (Physician)               [facility].   Pulmonary, Critical Care, and Sleep Medicine      F/U Pulmonary Consult           Name:  Stephanie Murray  MRN:  [id number]          DOB:  November 02, 1995  Hospital:  [facility]          Date:  02/12/2021                This patient has been seen and evaluated at the request of Dr. Bernadene Bell for asthma exacerbation and neck swelling.         IMPRESSION:     ??    Asthma exacerbation with severe persistent asthma (poorly controlled):  Triggered by rhinovirus.  Patient has severe persistent asthma  at baseline (nightly nocturnal awakenings), only on SABA.  Had stopped ICS/LAMA/LABA combination therapy at the beginning of her pregnancy approximately 15 months ago at recommendation of her obstetrician.   ??  Viral LRTI: Secondary to rhinovirus   ??  Extensive pneumomediastinum with subcutaneous emphysema: Secondary to positive pressure ventilation in the setting of above --occurred on 02/11/2021   ??  Marijuana and tobacco dependence:  Smokes blunts once per week   ??  Insurance coverage issues: Patient reports she recently lost Medicaid coverage   ??  Severe obesity: Body mass index is 36.28 kg/m??.           Patient Active Problem List      Diagnosis  Code      ?  Acute asthma exacerbation  J45.901      ?  Rhinovirus infection  B34.8      ?  Acute bronchitis  J20.9      ?  Acute respiratory failure with hypoxia (HCC)  J96.01      ?  Obesity (BMI 30.0-34.9)  E66.9               RECOMMENDATIONS:     ??    AVOID BiPAP and avoid positive pressure ventilation.  Currently patient has not had progression, limited to neck and upper chest, no airway compromise  currently, patient able to swallow and handle all  secretions.  Continue serial examinations   ??  Continue aggressive IV steroids.  Will reassess tomorrow and determine when to transition to oral prednisone   ??  PPI while on steroids   ??  Continue scheduled bronchodilators: duonebs q4h, pulmicort nebs 24m BID, and albuterol PRN   ??  At discharge, patient will need ICS/LABA/LAMA, advised Symbicort 160 and Spiriva HandiHaler.  Counseled patient to reapply for Medicaid   ??  Supplemental oxygen to maintain SpO2 >88%.  Use high flow nasal cannula while severely dyspneic.  May consider heliox if dyspnea worsens   ??  Follow-up total IgE, regional allergy panel   ??  Please assess for home oxygen need prior to discharge   ??  Aggressive pulmonary toileting/bronchial hygiene   ??  Frequent incentive spirometry   ??  Aspiration  precautions including elevating HOB >30deg   ??  PT/OT, OOB, ambulate with assistance as tolerated   ??  DVT ppx per primary service   ??  Counseled patient on avoidance of asthma triggers.  Counseled husband to quit smoking   ??  Counseled regarding marijuana cessation   ??  Will follow                Subjective:     02/12/21   Patient seen and examined at bedside.  No acute events overnight.  Patient remains in the ER.  Reports that swelling in neck did not worsen, now feels subcu emphysema along her upper chest and into her thoracic cavity.  Denies nausea, vomiting, diarrhea,  chest pain.         HPI:   Patient is a 26 y.o. female with a past medical history of asthma and marijuana dependence, presented to [facility] with acute onset of shortness of breath.  Patient reports that symptoms started a few hours prior to presentation.  Patient  reports that she was taking care of her sick daughter, then developed symptoms of acute wheezing and shortness of breath, took nebulizer, slept, symptoms did not abate, took additional nebulizer treatment, no improvement, so patient presented to [facility].  While there, patient was treated for  asthma exacerbation, placed on BiPAP, overnight developed neck swelling, this morning stopped, CT neck performed.  Patient receiving steroids and bronchodilators.  With regards to her asthma, patient  reports she was diagnosed around age 8, was on controller inhalers most recently Trelegy until she became pregnant, discontinued by her obstetrician (child is now 36 months old).  Patient reports that she only uses as needed albuterol about once a week  prior to becoming sick, however has nightly nocturnal awakenings.  Reports smoking 1 blunt once a week, marijuana.  Patient reports no occupational exposures to coal/silica/asbestos.  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   Patient denies nausea, vomiting, diarrhea, hemoptysis.           Past Medical History:        Diagnosis  Date         ?  Asthma           No past surgical history on file.      Prior to Admission medications             Medication  Sig  Start Date  End Date  Taking?  Authorizing Provider            albuterol (Proventil HFA) 90 mcg/actuation inhaler  Take 2 Puffs by inhalation every four (4) hours as needed for Wheezing.   Patient not taking: Reported on 02/10/2021  01/03/21      Glean Salvo, MD     albuterol (PROVENTIL VENTOLIN) 2.5 mg /3 mL (0.083 %) nebu  Take 2.5 mg by inhalation every four (4) hours as needed.  06/27/20      Other, Phys, MD     albuterol (PROVENTIL HFA, VENTOLIN HFA, PROAIR HFA) 90 mcg/actuation inhaler  Take 1-2 Puffs by inhalation.   Patient not taking: Reported on 02/10/2021  06/27/20      Other, Phys, MD     albuterol-ipratropium (DUO-NEB) 2.5 mg-0.5 mg/3 ml nebu  3 mL by Nebulization route every four (4) hours as needed for Wheezing.        Other, Phys, MD     fluticasone/umeclidin/vilanter (TRELEGY ELLIPTA IN)  Take 2 Puffs by inhalation two (2) times a day. 222mg   Patient not taking: Reported on 02/10/2021        Other, Phys, MD            ipratropium (Atrovent HFA) 17 mcg/actuation inhaler  Take 1 Puff  by inhalation every six (6) hours as needed for Wheezing.   Patient not taking: Reported on 02/10/2021  06/21/20      RShearon Balo PA-C          Allergies        Allergen  Reactions         ?  Sulfa (Sulfonamide Antibiotics)  Hives           Social History          Tobacco Use         ?  Smoking status:  Never     ?  Smokeless tobacco:  Never       Substance Use Topics         ?  Alcohol use:  Not Currently         No family history on file.         Current Facility-Administered Medications          Medication  Dose  Route  Frequency           ?  pantoprazole (PROTONIX) 40 mg in 0.9% sodium chloride 10 mL injection   40 mg  IntraVENous  DAILY     ?  methylPREDNISolone (PF) (SOLU-MEDROL) injection 40 mg   40 mg  IntraVENous  Q12H     ?  insulin lispro (HUMALOG) injection     SubCUTAneous  AC&HS     ?  budesonide (PULMICORT) 1,000 mcg/2 mL nebulizer susp   1,000 mcg  Nebulization  BID RT     ?  albuterol/ipratropium (DUONEB) neb solution   1 Dose  Nebulization  Q4H RT     ?  cefTRIAXone (ROCEPHIN) 1 g in sterile water (preservative free) 10 mL IV syringe   1 g  IntraVENous  Q24H     ?  azithromycin (ZITHROMAX) 500 mg in 0.9% sodium chloride 250 mL (VIAL-MATE)   500 mg  IntraVENous  Q24H     ?  sodium chloride (NS) flush 5-40 mL   5-40 mL  IntraVENous  Q8H     ?  enoxaparin (LOVENOX) injection 40 mg   40 mg  SubCUTAneous  DAILY           ?  montelukast (SINGULAIR) tablet 10 mg   10 mg  Oral  QHS           Review of Systems:   A comprehensive ROS was obtained as stated in HPI, all others are negative           Objective:     Vital Signs:       Visit Vitals   BP  120/76 (BP 1 Location: Left upper arm, BP Patient Position: Sitting)      Pulse  (!) 116      Temp  98.2 ??F (36.8 ??C)      Resp  26      Ht  5' 1"  (1.549 m)      Wt  87.1 kg (192 lb)      LMP  01/21/2021      SpO2  96%      Breastfeeding  Unknown      BMI  36.28 kg/m??                O2 Device: Nasal cannula     O2 Flow Rate (L/min): 2 l/min       Temp  (24hrs), Avg:98.1 ??F (36.7 ??C), Min:98 ??F (36.7 ??C), Max:98.2 ??F (36.8 ??C)           Intake/Output:    Last shift:      No intake/output data recorded.   Last 3 shifts: No intake/output data recorded.   No intake or output data in the 24 hours ending 02/12/21 2354       Physical Exam:       General:   Alert, cooperative, no distress, appears stated age, obese, sitting up in ER gurney        Head:   Normocephalic, without obvious abnormality, atraumatic.     Eyes:   Conjunctivae/corneas clear.ANicteric, no ocular drainage        Nose:  Nares normal. Mucosa normal. No drainage or sinus tenderness.        Throat:  Lips, mucosa dry. NO thrush; good dentition, no oral lesions        Neck:  Supple, symmetrical, trachea midline, thick with crepitus on palpation, no adenopathy, thyroid: no enlargment/tenderness/nodules, no carotid bruit and no JVD.   Crepitus present due to subcu emphysema, stable        Back:    Symmetric, no curvature, no spine tenderness or flank pain     Lungs:    Fair air entry throughout all lung fields, extensive wheezes throughout all lung fields along with coarse rhonchi throughout        Chest wall:   No tenderness or deformity. NO CREPITUS        Heart:   Regular rate and rhythm, S1, S2 normal, no murmur, click, rub or gallop.        Abdomen:    Soft, non-tender.Bowel sounds normal. No masses,  No organomegaly. No paradoxical motion        Extremities:  normal, atraumatic, no cyanosis or edema.  No clubbing        Pulses:  1-2+ and symmetric all extremities.        Skin:  Skin color, texture, turgor normal. No rashes or lesions     Lymph nodes:  Cervical, supraclavicular, and axillary nodes normal.        Neurologic:  Grossly nonfocal, strength and coordination sensation grossly intact throughout extremities                Data review:     Labs:     Recent Results (from the past 24 hour(s))     METABOLIC PANEL, BASIC          Collection Time: 02/12/21  2:23 AM         Result  Value  Ref Range             Sodium  135 (L)  136 - 145 mmol/L       Potassium  4.9  3.5 - 5.5 mmol/L       Chloride  105  100 - 111 mmol/L       CO2  26  21 - 32 mmol/L       Anion gap  4  3.0 - 18 mmol/L       Glucose  121 (H)  74 - 99 mg/dL       BUN  17  7.0 - 18 MG/DL  Creatinine  0.80  0.6 - 1.3 MG/DL       BUN/Creatinine ratio  21 (H)  12 - 20         eGFR  >60  >60 ml/min/1.41m       Calcium  9.5  8.5 - 10.1 MG/DL       CBC W/O DIFF          Collection Time: 02/12/21  2:23 AM         Result  Value  Ref Range            WBC  15.4 (H)  4.6 - 13.2 K/uL       RBC  4.87  4.20 - 5.30 M/uL       HGB  11.7 (L)  12.0 - 16.0 g/dL       HCT  36.3  35.0 - 45.0 %       MCV  74.5 (L)  78.0 - 100.0 FL       MCH  24.0  24.0 - 34.0 PG       MCHC  32.2  31.0 - 37.0 g/dL       RDW  18.1 (H)  11.6 - 14.5 %       PLATELET  408  135 - 420 K/uL       MPV  9.9  9.2 - 11.8 FL       NRBC  0.0  0 PER 100 WBC       ABSOLUTE NRBC  0.00  0.00 - 0.01 K/uL       PHOSPHORUS          Collection Time: 02/12/21  2:23 AM         Result  Value  Ref Range            Phosphorus  3.6  2.5 - 4.9 MG/DL       MAGNESIUM          Collection Time: 02/12/21  2:23 AM         Result  Value  Ref Range            Magnesium  2.5  1.6 - 2.6 mg/dL       GLUCOSE, POC          Collection Time: 02/12/21  7:59 AM         Result  Value  Ref Range            Glucose (POC)  120 (H)  70 - 110 mg/dL       GLUCOSE, POC          Collection Time: 02/12/21 12:25 PM         Result  Value  Ref Range            Glucose (POC)  128 (H)  70 - 110 mg/dL       GLUCOSE, POC          Collection Time: 02/12/21  9:50 PM         Result  Value  Ref Range            Glucose (POC)  115 (H)  70 - 110 mg/dL        CT Results (most recent):   Results from Hospital Encounter encounter on 02/10/21      CT NECK SOFT TISSUE W CONT      Narrative   EXAMINATION: CT soft tissue neck with contrast  INDICATION: Acute neck swelling, asthma exacerbation      COMPARISON: None      TECHNIQUE: CT of the neck with IV  contrast. All CT scans at this facility are   performed using dose optimization technique as appropriate to a performed exam,   to include automated exposure control, adjustment of the mA and/or kV according   to patient size (including appropriate matching first site specific   examinations), or use of iterative reconstruction technique.      FINDINGS:      Airways/mucosa/tonsils: Adenoids and tonsils unremarkable. Airways appear   largely patent. No suspicious mucosal lesions clearly identified.      Major salivary glands: Parotid glands and submandibular glands unremarkable.      Thyroid: Unremarkable.      Lymph nodes: No adenopathy by size criteria.      Vessels: Major vessels unremarkable.      Soft tissues: Extensive patchy soft tissue emphysema is notable in the left   greater than right lower neck extending into anterior chest and large volume   incompletely imaged pneumomediastinum. Globes are round and symmetric. No   suspicious rim-enhancing abscess identified.      Other: Left apical bandlike opacities, and minimal right apical groundglass   density. Paranasal sinuses and mastoids well-aerated. Imaged lower brain   unremarkable.      Bones: No acute osseous findings.      Impression   Extensive soft tissue emphysema neck extending into the chest, including   pneumomediastinum, which is new compared to recent prior chest CT. In the   absence of interval trauma, alveolar rupture with extension into the   pneumomediastinum and neck is the primary diagnostic consideration. No definite   pneumothorax identified in the upper chest. Consider repeat CT of the chest at   some point if clinically warranted.      Bandlike opacities at the left apex, and groundglass density at right apex,   probably atelectasis. Recommend attention on follow-up imaging.        PFT Results  (Last 48 hours)             None                    Echo Results  (Last 48 hours)             None                  Imaging:   I have personally  reviewed the patients radiographs and have reviewed the reports:   CT chest without contrast from 02/10/2021 shows mainly clear lung fields bilaterally with some mild linear atelectasis with pleural thickening laterally.  No masses, infiltrates, consolidations, effusions seen.  CT neck from 02/11/2021 shows extensive  subcutaneous emphysema in neck extending down into anterior and central mediastinum consistent with pneumomediastinum, causing some compressive atelectasis of left lung.   CXR Results  (Last 48 hours)             None                      CT Results  (Last 48 hours)                               02/11/21 1702    CT NECK SOFT TISSUE W CONT  Final result        Impression:  Extensive soft tissue emphysema neck extending into the chest, including      pneumomediastinum, which is new compared to recent prior chest CT. In the      absence of interval trauma, alveolar rupture with extension into the      pneumomediastinum and neck is the primary diagnostic consideration. No definite      pneumothorax identified in the upper chest. Consider repeat CT of the chest at      some point if clinically warranted.             Bandlike opacities at the left apex, and groundglass density at right apex,      probably atelectasis. Recommend attention on follow-up imaging.              Narrative:    EXAMINATION: CT soft tissue neck with contrast             INDICATION: Acute neck swelling, asthma exacerbation             COMPARISON: None             TECHNIQUE: CT of the neck with IV contrast. All CT scans at this facility are      performed using dose optimization technique as appropriate to a performed exam,      to include automated exposure control, adjustment of the mA and/or kV according      to patient size (including appropriate matching first site specific      examinations), or use of iterative reconstruction technique.             FINDINGS:             Airways/mucosa/tonsils: Adenoids and tonsils  unremarkable. Airways appear      largely patent. No suspicious mucosal lesions clearly identified.             Major salivary glands: Parotid glands and submandibular glands unremarkable.             Thyroid: Unremarkable.             Lymph nodes: No adenopathy by size criteria.             Vessels: Major vessels unremarkable.             Soft tissues: Extensive patchy soft tissue emphysema is notable in the left      greater than right lower neck extending into anterior chest and large volume      incompletely imaged pneumomediastinum. Globes are round and symmetric. No      suspicious rim-enhancing abscess identified.             Other: Left apical bandlike opacities, and minimal right apical groundglass      density. Paranasal sinuses and mastoids well-aerated. Imaged lower brain      unremarkable.             Bones: No acute osseous findings.                                         High complexity decision making was performed during the evaluation of this patient at high risk for decompensation with multiple organ involvement       Above mentioned total time spent on reviewing the case/medical record/data/notes/EMR/patient examination/documentation/coordinating care with nurse/consultants, exclusive of procedures with complex decision making performed and > 50% time spent in face  to  face evaluation.                   Mellody Memos  MD/MPH      Pulmonary, Critical Care Medicine   [facility]

## 2021-02-12 NOTE — ED Notes (Signed)
Pt in bed resting quietly. Medications administered. Pt states she does not eat eggs. Breakfast tray contains eggs. Will attempt to get pt another breakfast tray.

## 2021-02-12 NOTE — Progress Notes (Signed)
Progress Notes by Hebert Soho, NP at 02/12/21 1542                Author: Hebert Soho, NP  Service: Hospitalist  Author Type: Nurse Practitioner       Filed: 02/12/21 1551  Date of Service: 02/12/21 1542  Status: Attested           Editor: Hebert Soho, NP (Nurse Practitioner)  Cosigner: Amparo Bristol, MD at 02/12/21 1745          Attestation signed by Amparo Bristol, MD at 02/12/21 1745          Patient seen and examined independently and I agree with the assessment and plan per NP [facility].  Patient reports that neck swelling has improved,  but has not returned to baseline.  She is still wheezing, was worse overnight, but improved slightly from yesterday.  At this time, continue patient on IV antibiotics, will reduce IV steroids to twice daily.  Continue IV PPI.  Continue bronchodilators.   Complete smoking cessation and avoidance of secondhand smoke exposure.  Follow total IgE, regional allergy panel.      Regarding crepitus, avoid positive pressure ventilation.  BiPAP discontinued yesterday.  No airway compromise, patient is able to swallow and manage secretions.  Continue serial examinations.      Assess for home oxygen prior to discharge.  Pulmonology input appreciated.                              [facility]   Progress Note      Patient: Stephanie Murray Age:  26 y.o. DOB: [birth year] MR#:  [id number] SSN: [id number]   Date: 02/12/2021         Subjective:     Alert and oriented x4. +wheeze, +SOB. On NC           Assessment/Plan:     1. Asthma exacerbation with +Rhinovirus and possible superimposed bacterial PNA. Pulmonology consult and appreciate assistance.    2. Tachycardia secondary to duoneb treatments    3. Leukocytosis secondary to URI v Steroid use    4. Extensive pneumomediastinum with subcutaneous emphysema secondary to bipap    5. Hyperglycemia secondary to steroid use vs infection vs DM. DM  ruled out   6. Obesity BMI 36.28 kg/m2   7. Marijuana and tobacco use   8. Hyponatremia      Plan   1.  Pulmonology consult and appreciate assistance. Recommendations to discontinue bipap due to subcutaneous emphysema, avoid positive pressure  ventilation, monitor oxygen demand and wean, continue IV steroids, pulmicort, duonebs, and counseled on tobacco cessation and avoidance of asthma triggers. At discharge will need ICS/LABA/LAMA, advised Symbicort and Spiriva.   2.  POC glucose and SSI while on steroids.    3.  Monitor metabolic panel and renal function    4.  Patient is alert and oriented x4 and of sound mind to update family      30 minutes in total spent today in preparation for visit, review of external notes and test results, review of test results, order placement, obtaining history, physical exam  of the patient, and/or counseling the patient concerning diagnosis, test results, treatment plan and speaking with physicians and nurses involved in patient's care. Additional time spent in review and independent interpretation of external notes, imaging,  labs via hospital EMR and/or Care Everywhere, as well as pre-charting activity all  of which occur on the day of service.      10 minutes were spent in Preparation to see Patient and Obtaining and Reviewing Separate History   5 minutes were spent in Examination, Counseling, & Educating Patient   5 minutes were spent (if any) in [place] with Nursing Staff and Physicians   10 minutes were spent Documenting in EHR and Interpreting Tests Independently      A Total of 30 minutes were spent seeing this Patient.           Case discussed with:  [x]  Patient  [x] Family  [] Nursing  []  Case Management   DVT Prophylaxis:  [x]  Lovenox  []  Hep SQ  []  SCDs  []  Coumadin   []  On Heparin gtt        Objective:     VS: Visit Vitals      BP  120/75     Pulse  (!) 107     Temp  98.2 ??F (36.8 ??C)     Resp  20     Ht  5' 1"  (1.549 m)     Wt  87.1 kg (192 lb)     LMP   01/21/2021     SpO2  95%     Breastfeeding  Unknown        BMI  36.28 kg/m??         Tmax/24hrs: Temp (24hrs), Avg:98 ??F (36.7 ??C), Min:97.9 ??F (36.6 ??C), Max:98.2 ??F (36.8 ??C)   No intake or output data in the 24 hours ending 02/12/21 1542      General:         Alert, cooperative, no acute distress     HEENT:           NC, Atraumatic.  PERRLA, anicteric sclerae.   Lungs:            wheeze t/o   Heart:              RRR, No murmur, No Rubs, No Gallops   Abdomen:      Soft, Non distended, Non tender.  +Bowel sounds, no HSM   Extremities:   No c/c/e   Psych:              Good insight. Not anxious or agitated.   Neurologic:     Alert and oriented X 4.  No acute neurological deficits       Labs:       Recent Results (from the past 24 hour(s))     GLUCOSE, POC          Collection Time: 02/11/21  9:41 PM         Result  Value  Ref Range            Glucose (POC)  123 (H)  70 - 110 mg/dL       METABOLIC PANEL, BASIC          Collection Time: 02/12/21  2:23 AM         Result  Value  Ref Range            Sodium  135 (L)  136 - 145 mmol/L       Potassium  4.9  3.5 - 5.5 mmol/L       Chloride  105  100 - 111 mmol/L       CO2  26  21 - 32 mmol/L       Anion gap  4  3.0 -  18 mmol/L       Glucose  121 (H)  74 - 99 mg/dL       BUN  17  7.0 - 18 MG/DL       Creatinine  0.80  0.6 - 1.3 MG/DL       BUN/Creatinine ratio  21 (H)  12 - 20         eGFR  >60  >60 ml/min/1.30m       Calcium  9.5  8.5 - 10.1 MG/DL       CBC W/O DIFF          Collection Time: 02/12/21  2:23 AM         Result  Value  Ref Range            WBC  15.4 (H)  4.6 - 13.2 K/uL       RBC  4.87  4.20 - 5.30 M/uL       HGB  11.7 (L)  12.0 - 16.0 g/dL       HCT  36.3  35.0 - 45.0 %       MCV  74.5 (L)  78.0 - 100.0 FL       MCH  24.0  24.0 - 34.0 PG       MCHC  32.2  31.0 - 37.0 g/dL       RDW  18.1 (H)  11.6 - 14.5 %       PLATELET  408  135 - 420 K/uL       MPV  9.9  9.2 - 11.8 FL       NRBC  0.0  0 PER 100 WBC       ABSOLUTE NRBC  0.00  0.00 - 0.01 K/uL       PHOSPHORUS           Collection Time: 02/12/21  2:23 AM         Result  Value  Ref Range            Phosphorus  3.6  2.5 - 4.9 MG/DL       MAGNESIUM          Collection Time: 02/12/21  2:23 AM         Result  Value  Ref Range            Magnesium  2.5  1.6 - 2.6 mg/dL       GLUCOSE, POC          Collection Time: 02/12/21  7:59 AM         Result  Value  Ref Range            Glucose (POC)  120 (H)  70 - 110 mg/dL       GLUCOSE, POC          Collection Time: 02/12/21 12:25 PM         Result  Value  Ref Range            Glucose (POC)  128 (H)  70 - 110 mg/dL              Signed By:  SHebert Soho NP           February 12, 2021

## 2021-02-12 NOTE — ED Notes (Signed)
Pt currently sleeping in no distress with lights dimmed. No needs at this time.

## 2021-02-12 NOTE — ED Notes (Signed)
Patient assessment complete. Patient remains vitally stable, on 2L nasal cannula and upon palpation patient has subcutaneous emphysema. This can be felt around patient neck and upper chest. Will continue to monitor.

## 2021-02-13 LAB — CBC
Hematocrit: 35.3 % (ref 35.0–45.0)
Hemoglobin: 11.3 g/dL — ABNORMAL LOW (ref 12.0–16.0)
MCH: 23.7 PG — ABNORMAL LOW (ref 24.0–34.0)
MCHC: 32 g/dL (ref 31.0–37.0)
MCV: 74 FL — ABNORMAL LOW (ref 78.0–100.0)
MPV: 8.8 FL — ABNORMAL LOW (ref 9.2–11.8)
NRBC Absolute: 0.02 10*3/uL — ABNORMAL HIGH (ref 0.00–0.01)
Nucleated RBCs: 0.1 PER 100 WBC — ABNORMAL HIGH
Platelets: 391 10*3/uL (ref 135–420)
RBC: 4.77 M/uL (ref 4.20–5.30)
RDW: 18 % — ABNORMAL HIGH (ref 11.6–14.5)
WBC: 15.2 10*3/uL — ABNORMAL HIGH (ref 4.6–13.2)

## 2021-02-13 LAB — BASIC METABOLIC PANEL
Anion Gap: 5 mmol/L (ref 3.0–18)
BUN: 19 MG/DL — ABNORMAL HIGH (ref 7.0–18)
Bun/Cre Ratio: 27 — ABNORMAL HIGH (ref 12–20)
CO2: 24 mmol/L (ref 21–32)
Calcium: 9.2 MG/DL (ref 8.5–10.1)
Chloride: 106 mmol/L (ref 100–111)
Creatinine: 0.7 MG/DL (ref 0.6–1.3)
ESTIMATED GLOMERULAR FILTRATION RATE: >60 mL/min/{1.73_m2} (ref >60)
Glucose: 115 mg/dL — ABNORMAL HIGH (ref 74–99)
Potassium: 4.4 mmol/L (ref 3.5–5.5)
Sodium: 135 mmol/L — ABNORMAL LOW (ref 136–145)

## 2021-02-13 LAB — CULTURE, RESPIRATORY/SPUTUM/BRONCH W GRAM STAIN
Culture result:: NORMAL
Culture: NORMAL
GRAM STAIN: NONE SEEN
Gram Stain Result: NONE SEEN

## 2021-02-13 LAB — POCT GLUCOSE
POC Glucose: 115 mg/dL — ABNORMAL HIGH (ref 70–110)
POC Glucose: 98 mg/dL (ref 70–110)

## 2021-02-13 LAB — CBC W/O DIFF
ABSOLUTE NRBC: 0.02 10*3/uL — ABNORMAL HIGH (ref 0.00–0.01)
HCT: 35.3 % (ref 35.0–45.0)
HGB: 11.3 g/dL — ABNORMAL LOW (ref 12.0–16.0)
MCH: 23.7 PG — ABNORMAL LOW (ref 24.0–34.0)
MCHC: 32 g/dL (ref 31.0–37.0)
MCV: 74 FL — ABNORMAL LOW (ref 78.0–100.0)
MPV: 8.8 FL — ABNORMAL LOW (ref 9.2–11.8)
NRBC: 0.1 PER 100 WBC — ABNORMAL HIGH
PLATELET: 391 10*3/uL (ref 135–420)
RBC: 4.77 M/uL (ref 4.20–5.30)
RDW: 18 % — ABNORMAL HIGH (ref 11.6–14.5)
WBC: 15.2 10*3/uL — ABNORMAL HIGH (ref 4.6–13.2)

## 2021-02-13 LAB — METABOLIC PANEL, BASIC
Anion gap: 5 mmol/L (ref 3.0–18)
BUN/Creatinine ratio: 27 — ABNORMAL HIGH (ref 12–20)
BUN: 19 MG/DL — ABNORMAL HIGH (ref 7.0–18)
CO2: 24 mmol/L (ref 21–32)
Calcium: 9.2 MG/DL (ref 8.5–10.1)
Chloride: 106 mmol/L (ref 100–111)
Creatinine: 0.7 MG/DL (ref 0.6–1.3)
Glucose: 115 mg/dL — ABNORMAL HIGH (ref 74–99)
Potassium: 4.4 mmol/L (ref 3.5–5.5)
Sodium: 135 mmol/L — ABNORMAL LOW (ref 136–145)
eGFR: >60 mL/min/{1.73_m2} (ref >60)

## 2021-02-13 LAB — GLUCOSE, POC
Glucose (POC): 115 mg/dL — ABNORMAL HIGH (ref 70–110)
Glucose (POC): 98 mg/dL (ref 70–110)

## 2021-02-13 MED ORDER — BUDESONIDE-FORMOTEROL HFA 160 MCG-4.5 MCG/ACTUATION AEROSOL INHALER
Freq: Two times a day (BID) | RESPIRATORY_TRACT | 0 refills | Status: AC
Start: 2021-02-13 — End: ?

## 2021-02-13 MED ORDER — AMOXICILLIN CLAVULANATE 875 MG-125 MG TAB
875-125 mg | ORAL_TABLET | Freq: Two times a day (BID) | ORAL | 0 refills | Status: AC
Start: 2021-02-13 — End: 2021-02-18

## 2021-02-13 MED ORDER — ALBUTEROL SULFATE HFA 90 MCG/ACTUATION AEROSOL INHALER
90 mcg/actuation | RESPIRATORY_TRACT | 0 refills | Status: AC | PRN
Start: 2021-02-13 — End: ?

## 2021-02-13 MED ORDER — PREDNISONE 20 MG TAB
20 mg | Freq: Every day | ORAL | Status: DC
Start: 2021-02-13 — End: 2021-02-14
  Administered 2021-02-14: 14:00:00 via ORAL

## 2021-02-13 MED ORDER — FAMOTIDINE 40 MG TAB
40 mg | ORAL_TABLET | Freq: Every day | ORAL | 0 refills | Status: AC
Start: 2021-02-13 — End: ?

## 2021-02-13 MED ORDER — PREDNISONE 20 MG TAB
20 mg | ORAL_TABLET | ORAL | 0 refills | Status: AC
Start: 2021-02-13 — End: 2021-03-02

## 2021-02-13 MED ORDER — TIOTROPIUM BROMIDE 2.5 MCG/ACTUATION MIST FOR INHALATION
2.5 mcg/actuation | Freq: Every day | RESPIRATORY_TRACT | 0 refills | Status: AC
Start: 2021-02-13 — End: ?

## 2021-02-13 MED ORDER — MONTELUKAST 10 MG TAB
10 mg | ORAL_TABLET | Freq: Every evening | ORAL | 0 refills | Status: AC
Start: 2021-02-13 — End: ?

## 2021-02-13 MED FILL — BUDESONIDE 1 MG/2 ML NEB SUSPENSION: 1 mg/2 mL | RESPIRATORY_TRACT | Qty: 1

## 2021-02-13 MED FILL — ALBUTEROL SULFATE 0.083 % (0.83 MG/ML) SOLN FOR INHALATION: 2.5 mg /3 mL (0.083 %) | RESPIRATORY_TRACT | Qty: 1

## 2021-02-13 MED FILL — AZITHROMYCIN 500 MG IV SOLUTION: 500 mg | INTRAVENOUS | Qty: 5

## 2021-02-13 MED FILL — LOVENOX 40 MG/0.4 ML SUBCUTANEOUS SYRINGE: 40 mg/0.4 mL | SUBCUTANEOUS | Qty: 0.4

## 2021-02-13 MED FILL — CEFTRIAXONE 1 GRAM SOLUTION FOR INJECTION: 1 gram | INTRAMUSCULAR | Qty: 1

## 2021-02-13 MED FILL — SOLU-MEDROL (PF) 40 MG/ML SOLUTION FOR INJECTION: 40 mg/mL | INTRAMUSCULAR | Qty: 1

## 2021-02-13 MED FILL — MONTELUKAST 10 MG TAB: 10 mg | ORAL | Qty: 1

## 2021-02-13 MED FILL — PANTOPRAZOLE 40 MG IV SOLR: 40 mg | INTRAVENOUS | Qty: 40

## 2021-02-13 NOTE — ED Notes (Signed)
Pt currently sitting up in no distress resting, appears comfortable. Lights dimmed. Assistance offered, no needs at this time.

## 2021-02-13 NOTE — ED Notes (Signed)
O2 removed for home oxygen test

## 2021-02-13 NOTE — Consults (Signed)
Consults by Mellody Memos, MD at 02/13/21 1228                Author: Mellody Memos, MD  Service: Pulmonary Disease  Author Type: Physician       Filed: 02/13/21 1244  Date of Service: 02/13/21 1228  Status: Signed          Editor: Mellody Memos, MD (Physician)               [facility].   Pulmonary, Critical Care, and Sleep Medicine      F/U Pulmonary Consult           Name:  Stephanie Murray  MRN:  [id number]          DOB:  1995-12-19  Hospital:  [facility]          Date:  02/13/2021                This patient has been seen and evaluated at the request of Dr. Bernadene Bell for asthma exacerbation and neck swelling.         IMPRESSION:     ??    Asthma exacerbation with severe persistent asthma (poorly controlled):  Triggered by rhinovirus.  Patient has severe persistent asthma  at baseline (nightly nocturnal awakenings), only on SABA.  Had stopped ICS/LAMA/LABA combination therapy at the beginning of her pregnancy approximately 15 months ago at recommendation of her obstetrician.   ??  Viral LRTI: Secondary to rhinovirus   ??  Extensive pneumomediastinum with subcutaneous emphysema: Secondary to positive pressure ventilation in the setting of above --occurred on 02/11/2021   ??  Marijuana and tobacco dependence:  Smokes blunts once per week   ??  Insurance coverage issues: Patient reports she recently lost Medicaid coverage   ??  Severe obesity: Body mass index is 36.28 kg/m??.           Patient Active Problem List      Diagnosis  Code      ?  Acute asthma exacerbation  J45.901      ?  Rhinovirus infection  B34.8      ?  Acute bronchitis  J20.9      ?  Acute respiratory failure with hypoxia (HCC)  J96.01      ?  Obesity (BMI 30.0-34.9)  E66.9               RECOMMENDATIONS:     ??    AVOID BiPAP and avoid positive pressure ventilation.  Currently patient has not had progression.  Ok to discharge   ??  Advise prednisone taper 70m x4d, 374mx4d, 2047m 4d, then 48m40md   ??  PPI while  on steroids   ??  Continue scheduled bronchodilators: duonebs q4h, pulmicort nebs 1mg 53m, and albuterol PRN   ??  At discharge, patient will need ICS/LABA/LAMA, advised Symbicort 160 and Spiriva HandiHaler.  Counseled patient to reapply for Medicaid   ??  Supplemental oxygen to maintain SpO2 >88%.  Use high flow nasal cannula while severely dyspneic.  May consider heliox if dyspnea worsens   ??  Follow-up total IgE, regional allergy panel   ??  Please assess for home oxygen need prior to discharge -- discussed how to monitor at home   ??  Aggressive pulmonary toileting/bronchial hygiene   ??  Frequent incentive spirometry   ??  Aspiration precautions including elevating HOB >30deg   ??  PT/OT, OOB, ambulate with assistance as  tolerated   ??  DVT ppx per primary service   ??  Counseled patient on avoidance of asthma triggers.  Counseled husband to quit smoking   ??  Counseled regarding marijuana cessation   ??  Will follow      F/U in pulm clinic in 3-6 weeks post discharge             Subjective:     02/13/21   Patient seen and examined at bedside.  No acute events overnight.  Patient remains in the ER.  Reports that swelling in neck is improving.  Still feels Subcu emphysema along her upper chest and into her thoracic cavity.  Denies nausea, vomiting, diarrhea,  chest pain.  Reports dypsnea is improving         HPI:   Patient is a 26 y.o. female with a past medical history of asthma and marijuana dependence, presented to [facility] with acute onset of shortness of breath.  Patient reports that symptoms started a few hours prior to presentation.  Patient  reports that she was taking care of her sick daughter, then developed symptoms of acute wheezing and shortness of breath, took nebulizer, slept, symptoms did not abate, took additional nebulizer treatment, no improvement, so patient presented to [facility].  While there, patient was treated for asthma exacerbation, placed on BiPAP, overnight  developed neck swelling, this morning stopped, CT neck performed.  Patient receiving steroids and bronchodilators.  With regards to her asthma, patient  reports she was diagnosed around age 56, was on controller inhalers most recently Trelegy until she became pregnant, discontinued by her obstetrician (child is now 61 months old).  Patient reports that she only uses as needed albuterol about once a week  prior to becoming sick, however has nightly nocturnal awakenings.  Reports smoking 1 blunt once a week, marijuana.  Patient reports no occupational exposures to coal/silica/asbestos.  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   Patient denies nausea, vomiting, diarrhea, hemoptysis.           Past Medical History:        Diagnosis  Date         ?  Asthma           No past surgical history on file.      Prior to Admission medications             Medication  Sig  Start Date  End Date  Taking?  Authorizing Provider            albuterol (Proventil HFA) 90 mcg/actuation inhaler  Take 2 Puffs by inhalation every four (4) hours as needed for Wheezing.   Patient not taking: Reported on 02/10/2021  01/03/21      Glean Salvo, MD     albuterol (PROVENTIL VENTOLIN) 2.5 mg /3 mL (0.083 %) nebu  Take 2.5 mg by inhalation every four (4) hours as needed.  06/27/20      Other, Phys, MD     albuterol (PROVENTIL HFA, VENTOLIN HFA, PROAIR HFA) 90 mcg/actuation inhaler  Take 1-2 Puffs by inhalation.   Patient not taking: Reported on 02/10/2021  06/27/20      Other, Phys, MD     albuterol-ipratropium (DUO-NEB) 2.5 mg-0.5 mg/3 ml nebu  3 mL by Nebulization route every four (4) hours as needed for Wheezing.        Other, Phys, MD     fluticasone/umeclidin/vilanter (TRELEGY ELLIPTA IN)  Take 2 Puffs by inhalation two (2) times a day. 214mg   Patient not taking: Reported on 02/10/2021        Other, Phys, MD            ipratropium (Atrovent HFA) 17 mcg/actuation inhaler  Take 1 Puff by inhalation every six (6) hours as needed for  Wheezing.   Patient not taking: Reported on 02/10/2021  06/21/20      RShearon Balo PA-C          Allergies        Allergen  Reactions         ?  Sulfa (Sulfonamide Antibiotics)  Hives           Social History          Tobacco Use         ?  Smoking status:  Never     ?  Smokeless tobacco:  Never       Substance Use Topics         ?  Alcohol use:  Not Currently         No family history on file.         Current Facility-Administered Medications          Medication  Dose  Route  Frequency           ?  pantoprazole (PROTONIX) 40 mg in 0.9% sodium chloride 10 mL injection   40 mg  IntraVENous  DAILY     ?  methylPREDNISolone (PF) (SOLU-MEDROL) injection 40 mg   40 mg  IntraVENous  Q12H     ?  insulin lispro (HUMALOG) injection     SubCUTAneous  AC&HS     ?  budesonide (PULMICORT) 1,000 mcg/2 mL nebulizer susp   1,000 mcg  Nebulization  BID RT     ?  albuterol/ipratropium (DUONEB) neb solution   1 Dose  Nebulization  Q4H RT     ?  cefTRIAXone (ROCEPHIN) 1 g in sterile water (preservative free) 10 mL IV syringe   1 g  IntraVENous  Q24H     ?  azithromycin (ZITHROMAX) 500 mg in 0.9% sodium chloride 250 mL (VIAL-MATE)   500 mg  IntraVENous  Q24H     ?  sodium chloride (NS) flush 5-40 mL   5-40 mL  IntraVENous  Q8H     ?  enoxaparin (LOVENOX) injection 40 mg   40 mg  SubCUTAneous  DAILY           ?  montelukast (SINGULAIR) tablet 10 mg   10 mg  Oral  QHS           Review of Systems:   A comprehensive ROS was obtained as stated in HPI, all others are negative           Objective:     Vital Signs:       Visit Vitals   BP  120/60      Pulse  (!) 131      Temp  98 ??F (36.7 ??C)      Resp  20      Ht  5' 1"  (1.549 m)      Wt  87.1 kg (192 lb)      LMP  01/21/2021      SpO2  94%      Breastfeeding  Unknown      BMI  36.28 kg/m??  O2 Device: Nasal cannula     O2 Flow Rate (L/min): 2 l/min       Temp (24hrs), Avg:98 ??F (36.7 ??C), Min:98 ??F (36.7 ??C), Max:98 ??F (36.7 ??C)           Intake/Output:    Last shift:      No  intake/output data recorded.   Last 3 shifts: 01/30 1901 - 02/01 0700   In: -    Out: 1000 [Urine:1000]      Intake/Output Summary (Last 24 hours) at 02/13/2021 1228   Last data filed at 02/13/2021 0319     Gross per 24 hour        Intake  --        Output  1000 ml        Net  -1000 ml            Physical Exam:       General:   Alert, cooperative, no distress, appears stated age, obese, sitting up in ER gurney        Head:   Normocephalic, without obvious abnormality, atraumatic.     Eyes:   Conjunctivae/corneas clear.ANicteric, no ocular drainage        Nose:  Nares normal. Mucosa normal. No drainage or sinus tenderness.        Throat:  Lips, mucosa dry. NO thrush; good dentition, no oral lesions        Neck:  Supple, symmetrical, trachea midline, thick with crepitus on palpation, no adenopathy, thyroid: no enlargment/tenderness/nodules, no carotid bruit and no JVD.   Crepitus present due to subcu emphysema, stable        Back:    Symmetric, no curvature, no spine tenderness or flank pain     Lungs:    Fair air entry throughout all lung fields, improved wheezing - now very faint, no rales/rhonchi        Chest wall:   No tenderness or deformity. NO CREPITUS        Heart:   Regular rate and rhythm, S1, S2 normal, no murmur, click, rub or gallop.        Abdomen:    Soft, non-tender.Bowel sounds normal. No masses,  No organomegaly. No paradoxical motion        Extremities:  normal, atraumatic, no cyanosis or edema.  No clubbing        Pulses:  1-2+ and symmetric all extremities.        Skin:  Skin color, texture, turgor normal. No rashes or lesions     Lymph nodes:  Cervical, supraclavicular, and axillary nodes normal.        Neurologic:  Grossly nonfocal, strength and coordination sensation grossly intact throughout extremities                Data review:     Labs:     Recent Results (from the past 24 hour(s))     GLUCOSE, POC          Collection Time: 02/12/21  9:50 PM         Result  Value  Ref Range            Glucose  (POC)  115 (H)  70 - 110 mg/dL       CBC W/O DIFF          Collection Time: 02/13/21  3:34 AM         Result  Value  Ref Range  WBC  15.2 (H)  4.6 - 13.2 K/uL       RBC  4.77  4.20 - 5.30 M/uL       HGB  11.3 (L)  12.0 - 16.0 g/dL       HCT  35.3  35.0 - 45.0 %       MCV  74.0 (L)  78.0 - 100.0 FL       MCH  23.7 (L)  24.0 - 34.0 PG       MCHC  32.0  31.0 - 37.0 g/dL       RDW  18.0 (H)  11.6 - 14.5 %       PLATELET  391  135 - 420 K/uL       MPV  8.8 (L)  9.2 - 11.8 FL       NRBC  0.1 (H)  0 PER 100 WBC       ABSOLUTE NRBC  0.02 (H)  0.00 - 0.01 K/uL       METABOLIC PANEL, BASIC          Collection Time: 02/13/21  3:34 AM         Result  Value  Ref Range            Sodium  135 (L)  136 - 145 mmol/L       Potassium  4.4  3.5 - 5.5 mmol/L       Chloride  106  100 - 111 mmol/L       CO2  24  21 - 32 mmol/L       Anion gap  5  3.0 - 18 mmol/L            Glucose  115 (H)  74 - 99 mg/dL            BUN  19 (H)  7.0 - 18 MG/DL       Creatinine  0.70  0.6 - 1.3 MG/DL       BUN/Creatinine ratio  27 (H)  12 - 20         eGFR  >60  >60 ml/min/1.25m       Calcium  9.2  8.5 - 10.1 MG/DL       GLUCOSE, POC          Collection Time: 02/13/21 11:53 AM         Result  Value  Ref Range            Glucose (POC)  98  70 - 110 mg/dL        CT Results (most recent):   [facility] 02[facility]10/21      CT NECK SOFT TISSUE W CONT      Narrative   EXAMINATION: CT soft tissue neck with contrast      INDICATION: Acute neck swelling, asthma exacerbation      COMPARISON: None      TECHNIQUE: CT of the neck with IV contrast. All CT scans at this facility are   performed using dose optimization technique as appropriate to a performed exam,   to include automated exposure control, adjustment of the mA and/or kV according   to patient size (including appropriate matching first site specific   examinations), or use of iterative reconstruction technique.      FINDINGS:      Airways/mucosa/tonsils: Adenoids and  tonsils unremarkable. Airways appear   largely patent. No suspicious mucosal lesions clearly identified.  Major salivary glands: Parotid glands and submandibular glands unremarkable.      Thyroid: Unremarkable.      Lymph nodes: No adenopathy by size criteria.      Vessels: Major vessels unremarkable.      Soft tissues: Extensive patchy soft tissue emphysema is notable in the left   greater than right lower neck extending into anterior chest and large volume   incompletely imaged pneumomediastinum. Globes are round and symmetric. No   suspicious rim-enhancing abscess identified.      Other: Left apical bandlike opacities, and minimal right apical groundglass   density. Paranasal sinuses and mastoids well-aerated. Imaged lower brain   unremarkable.      Bones: No acute osseous findings.      Impression   Extensive soft tissue emphysema neck extending into the chest, including   pneumomediastinum, which is new compared to recent prior chest CT. In the   absence of interval trauma, alveolar rupture with extension into the   pneumomediastinum and neck is the primary diagnostic consideration. No definite   pneumothorax identified in the upper chest. Consider repeat CT of the chest at   some point if clinically warranted.      Bandlike opacities at the left apex, and groundglass density at right apex,   probably atelectasis. Recommend attention on follow-up imaging.        PFT Results  (Last 48 hours)             None                    Echo Results  (Last 48 hours)             None                  Imaging:   I have personally reviewed the patients radiographs and have reviewed the reports:   CT chest without contrast from 02/10/2021 shows mainly clear lung fields bilaterally with some mild linear atelectasis with pleural thickening laterally.  No masses, infiltrates, consolidations, effusions seen.  CT neck from 02/11/2021 shows extensive  subcutaneous emphysema in neck extending down into anterior and central  mediastinum consistent with pneumomediastinum, causing some compressive atelectasis of left lung.   CXR Results  (Last 48 hours)             None                      CT Results  (Last 48 hours)                               02/11/21 1702    CT NECK SOFT TISSUE W CONT  Final result        Impression:           Extensive soft tissue emphysema neck extending into the chest, including      pneumomediastinum, which is new compared to recent prior chest CT. In the      absence of interval trauma, alveolar rupture with extension into the      pneumomediastinum and neck is the primary diagnostic consideration. No definite      pneumothorax identified in the upper chest. Consider repeat CT of the chest at      some point if clinically warranted.             Bandlike opacities at the left apex, and groundglass density  at right apex,      probably atelectasis. Recommend attention on follow-up imaging.              Narrative:    EXAMINATION: CT soft tissue neck with contrast             INDICATION: Acute neck swelling, asthma exacerbation             COMPARISON: None             TECHNIQUE: CT of the neck with IV contrast. All CT scans at this facility are      performed using dose optimization technique as appropriate to a performed exam,      to include automated exposure control, adjustment of the mA and/or kV according      to patient size (including appropriate matching first site specific      examinations), or use of iterative reconstruction technique.             FINDINGS:             Airways/mucosa/tonsils: Adenoids and tonsils unremarkable. Airways appear      largely patent. No suspicious mucosal lesions clearly identified.             Major salivary glands: Parotid glands and submandibular glands unremarkable.             Thyroid: Unremarkable.             Lymph nodes: No adenopathy by size criteria.             Vessels: Major vessels unremarkable.             Soft tissues: Extensive patchy soft tissue emphysema is  notable in the left      greater than right lower neck extending into anterior chest and large volume      incompletely imaged pneumomediastinum. Globes are round and symmetric. No      suspicious rim-enhancing abscess identified.             Other: Left apical bandlike opacities, and minimal right apical groundglass      density. Paranasal sinuses and mastoids well-aerated. Imaged lower brain      unremarkable.             Bones: No acute osseous findings.                                         High complexity decision making was performed during the evaluation of this patient at high risk for decompensation with multiple organ involvement       Above mentioned total time spent on reviewing the case/medical record/data/notes/EMR/patient examination/documentation/coordinating care with nurse/consultants, exclusive of procedures with complex decision making performed and > 50% time spent in face  to face evaluation.                   Mellody Memos  MD/MPH      Pulmonary, Critical Care Medicine   [facility]

## 2021-02-13 NOTE — Progress Notes (Signed)
For o2 home testing:     Take off oxygen for 15 min.   Do not do any ranges for o2 saturation  Room at rest % :   ( if under 88% do not need to walk and can reapply o2)  Room air with ambulation: %  Oxygen liters and saturation percentage : (if needed to be reapplied)  Do not add ranges or extra wording

## 2021-02-13 NOTE — ED Notes (Signed)
Attempted to call report for third time, no one answering, nursing supervisor aware.

## 2021-02-13 NOTE — Progress Notes (Signed)
Faxed oxygen order and clinicals Psychologist, counselling Thurmond Butts as oxygen not getting approved in [place]. Thurmond Butts stated can get o2 for asthma approved for 3 months

## 2021-02-13 NOTE — ED Notes (Signed)
Assumed care of patient, report received from [facility].

## 2021-02-13 NOTE — ED Notes (Signed)
Attempted to call report, nurse to call back.

## 2021-02-13 NOTE — ED Notes (Signed)
Attempted to call report to [facility], was placed on hold and no one answered. Charge RN aware.

## 2021-02-13 NOTE — Progress Notes (Signed)
Progress Notes by Hebert Soho, NP at 02/13/21 1657                Author: Hebert Soho, NP  Service: Hospitalist  Author Type: Nurse Practitioner       Filed: 02/13/21 1702  Date of Service: 02/13/21 1657  Status: Attested           Editor: Hebert Soho, NP (Nurse Practitioner)  Cosigner: Amparo Bristol, MD at 02/13/21 1724          Attestation signed by Amparo Bristol, MD at 02/13/21 1724          Patient seen and examined at bedside and I agree with the assessment and plan per NP [facility].  Patient is still wheezing but overall feeling much better.   She did qualify for home oxygen and this has been ordered, hopefully will be delivered tomorrow.  Recommendations regarding steroid taper noted, pulmonology input appreciated.      Discussed with patient, nursing, case management.  Discussed on IDT.                              [facility] Hospitalist Group   Progress Note      Patient: Stephanie Murray Age:  26 y.o. DOB: February 21, 1995 MR#:  [id number] SSN: [id number]   Date: 02/13/2021         Subjective:     Alert and oriented x4. SOB is better. She is aware that she needs home oxygen and waiting on approval and delivery.            Assessment/Plan:     1. Asthma exacerbation with +Rhinovirus and possible superimposed bacterial PNA. Pulmonology consult and appreciate assistance.    2. Tachycardia secondary to duoneb treatments    3. Leukocytosis secondary to URI v Steroid use    4. Extensive pneumomediastinum with subcutaneous emphysema secondary to bipap    5. Hyperglycemia secondary to steroid use vs infection vs DM. DM ruled out   6. Obesity BMI 36.28 kg/m2   7. Marijuana and tobacco use   8. Hyponatremia      Plan   1.  Pulmonology consult and appreciate assistance. Home oxygen is needed. Continue IV antibiotics and transition to PO, home singulair, spiriva  and symbicort at discharge. PPI while on steroids. Home with prednisone as  taper dose. Follow up as OP clinic in 3-6 weeks   2.  POC glucose, SSI while on steroids   3.  Home with oxygen as soon as delivered.    4.  Patient is alert and oriented x4 and of sound mind to update family.       25 minutes in total spent today in preparation for visit, review of external notes and test results, review of test results, order placement, obtaining history, physical exam  of the patient, and/or counseling the patient concerning diagnosis, test results, treatment plan and speaking with physicians and nurses involved in patient's care. Additional time spent in review and independent interpretation of external notes, imaging,  labs via hospital EMR and/or Care Everywhere, as well as pre-charting activity all of which occur on the day of service.      5 minutes were spent in Preparation to see Patient and Obtaining and Reviewing Separate History   5 minutes were spent in Examination, Counseling, & Educating Patient   5 minutes were spent (if any) in [place] with Nursing Staff  and Physicians   10 minutes were spent Documenting in EHR and Interpreting Tests Independently      A Total of 25 minutes were spent seeing this Patient.           Case discussed with:  [x]  Patient  [] Family  [] Nursing  []  Case Management   DVT Prophylaxis:  [x]  Lovenox  []  Hep SQ  []  SCDs  []  Coumadin   []  On Heparin gtt        Objective:     VS: Visit Vitals      BP  120/60     Pulse  (!) 124     Temp  98 ??F (36.7 ??C)     Resp  20     Ht  5' 1"  (1.549 m)     Wt  87.1 kg (192 lb)     LMP  01/21/2021     SpO2  95%     Breastfeeding  Unknown        BMI  36.28 kg/m??         Tmax/24hrs: Temp (24hrs), Avg:98 ??F (36.7 ??C), Min:98 ??F (36.7 ??C), Max:98 ??F (36.7 ??C)      Intake/Output Summary (Last 24 hours) at 02/13/2021 1658   Last data filed at 02/13/2021 0319     Gross per 24 hour        Intake  --        Output  1000 ml        Net  -1000 ml        General:         Alert, cooperative, no acute distress     HEENT:           NC,  Atraumatic.  PERRLA, anicteric sclerae.   Lungs:            wheeze t/o   Heart:              RRR, No murmur, No Rubs, No Gallops   Abdomen:      Soft, Non distended, Non tender.  +Bowel sounds, no HSM   Extremities:   No c/c/e   Psych:              Good insight. Not anxious or agitated.   Neurologic:     Alert and oriented X 4 .  No acute neurological deficits       Labs:       Recent Results (from the past 24 hour(s))     GLUCOSE, POC          Collection Time: 02/12/21  9:50 PM         Result  Value  Ref Range            Glucose (POC)  115 (H)  70 - 110 mg/dL       CBC W/O DIFF          Collection Time: 02/13/21  3:34 AM         Result  Value  Ref Range            WBC  15.2 (H)  4.6 - 13.2 K/uL       RBC  4.77  4.20 - 5.30 M/uL       HGB  11.3 (L)  12.0 - 16.0 g/dL       HCT  35.3  35.0 - 45.0 %       MCV  74.0 (L)  78.0 - 100.0 FL  MCH  23.7 (L)  24.0 - 34.0 PG       MCHC  32.0  31.0 - 37.0 g/dL       RDW  18.0 (H)  11.6 - 14.5 %       PLATELET  391  135 - 420 K/uL       MPV  8.8 (L)  9.2 - 11.8 FL       NRBC  0.1 (H)  0 PER 100 WBC       ABSOLUTE NRBC  0.02 (H)  0.00 - 0.01 K/uL       METABOLIC PANEL, BASIC          Collection Time: 02/13/21  3:34 AM         Result  Value  Ref Range            Sodium  135 (L)  136 - 145 mmol/L       Potassium  4.4  3.5 - 5.5 mmol/L       Chloride  106  100 - 111 mmol/L       CO2  24  21 - 32 mmol/L       Anion gap  5  3.0 - 18 mmol/L       Glucose  115 (H)  74 - 99 mg/dL       BUN  19 (H)  7.0 - 18 MG/DL       Creatinine  0.70  0.6 - 1.3 MG/DL       BUN/Creatinine ratio  27 (H)  12 - 20         eGFR  >60  >60 ml/min/1.82m       Calcium  9.2  8.5 - 10.1 MG/DL       GLUCOSE, POC          Collection Time: 02/13/21 11:53 AM         Result  Value  Ref Range            Glucose (POC)  98  70 - 110 mg/dL              Signed By:  SHebert Soho NP           February 13, 2021

## 2021-02-13 NOTE — ED Notes (Signed)
Pt currently resting quietly in no distress, appears comfortable. Assistance offered, no needs at this time.

## 2021-02-13 NOTE — ED Notes (Signed)
Report called to [facility]

## 2021-02-13 NOTE — ED Notes (Signed)
Patient stood in room for 30 seconds and had O2 saturation of 88% on room air.   2LNC applied and O2 saturation 93%

## 2021-02-14 LAB — CBC
Hematocrit: 34.9 % — ABNORMAL LOW (ref 35.0–45.0)
Hemoglobin: 11 g/dL — ABNORMAL LOW (ref 12.0–16.0)
MCH: 24 PG (ref 24.0–34.0)
MCHC: 31.5 g/dL (ref 31.0–37.0)
MCV: 76.2 FL — ABNORMAL LOW (ref 78.0–100.0)
MPV: 9.7 FL (ref 9.2–11.8)
NRBC Absolute: 0 10*3/uL (ref 0.00–0.01)
Nucleated RBCs: 0 PER 100 WBC
Platelets: 373 10*3/uL (ref 135–420)
RBC: 4.58 M/uL (ref 4.20–5.30)
RDW: 17.9 % — ABNORMAL HIGH (ref 11.6–14.5)
WBC: 11.9 10*3/uL (ref 4.6–13.2)

## 2021-02-14 LAB — BASIC METABOLIC PANEL
Anion Gap: 5 mmol/L (ref 3.0–18)
BUN: 23 MG/DL — ABNORMAL HIGH (ref 7.0–18)
Bun/Cre Ratio: 27 — ABNORMAL HIGH (ref 12–20)
CO2: 26 mmol/L (ref 21–32)
Calcium: 8.9 MG/DL (ref 8.5–10.1)
Chloride: 107 mmol/L (ref 100–111)
Creatinine: 0.86 MG/DL (ref 0.6–1.3)
ESTIMATED GLOMERULAR FILTRATION RATE: >60 mL/min/{1.73_m2} (ref >60)
Glucose: 77 mg/dL (ref 74–99)
Potassium: 4.3 mmol/L (ref 3.5–5.5)
Sodium: 138 mmol/L (ref 136–145)

## 2021-02-14 LAB — POCT GLUCOSE
POC Glucose: 96 mg/dL (ref 70–110)
POC Glucose: 77 mg/dL (ref 70–110)

## 2021-02-14 LAB — CBC W/O DIFF
ABSOLUTE NRBC: 0 10*3/uL (ref 0.00–0.01)
HCT: 34.9 % — ABNORMAL LOW (ref 35.0–45.0)
HGB: 11 g/dL — ABNORMAL LOW (ref 12.0–16.0)
MCH: 24 PG (ref 24.0–34.0)
MCHC: 31.5 g/dL (ref 31.0–37.0)
MCV: 76.2 FL — ABNORMAL LOW (ref 78.0–100.0)
MPV: 9.7 FL (ref 9.2–11.8)
NRBC: 0 PER 100 WBC
PLATELET: 373 10*3/uL (ref 135–420)
RBC: 4.58 M/uL (ref 4.20–5.30)
RDW: 17.9 % — ABNORMAL HIGH (ref 11.6–14.5)
WBC: 11.9 10*3/uL (ref 4.6–13.2)

## 2021-02-14 LAB — METABOLIC PANEL, BASIC
Anion gap: 5 mmol/L (ref 3.0–18)
BUN/Creatinine ratio: 27 — ABNORMAL HIGH (ref 12–20)
BUN: 23 MG/DL — ABNORMAL HIGH (ref 7.0–18)
CO2: 26 mmol/L (ref 21–32)
Calcium: 8.9 MG/DL (ref 8.5–10.1)
Chloride: 107 mmol/L (ref 100–111)
Creatinine: 0.86 MG/DL (ref 0.6–1.3)
Glucose: 77 mg/dL (ref 74–99)
Potassium: 4.3 mmol/L (ref 3.5–5.5)
Sodium: 138 mmol/L (ref 136–145)
eGFR: >60 mL/min/{1.73_m2} (ref >60)

## 2021-02-14 LAB — GLUCOSE, POC
Glucose (POC): 96 mg/dL (ref 70–110)
Glucose (POC): 77 mg/dL (ref 70–110)

## 2021-02-14 MED ORDER — L. ACIDOPHILUS,CASEI,RHAMNOSUS 50 BILLION CELL CAPSULE,DELAYED RELEASE
50 billion cell | ORAL_CAPSULE | Freq: Every day | ORAL | 0 refills | Status: AC
Start: 2021-02-14 — End: 2021-02-22

## 2021-02-14 MED ORDER — IPRATROPIUM-ALBUTEROL 2.5 MG-0.5 MG/3 ML NEB SOLUTION
2.5 mg-0.5 mg/3 ml | RESPIRATORY_TRACT | 0 refills | Status: AC | PRN
Start: 2021-02-14 — End: ?

## 2021-02-14 MED FILL — MONTELUKAST 10 MG TAB: 10 mg | ORAL | Qty: 1

## 2021-02-14 MED FILL — ALBUTEROL SULFATE 0.083 % (0.83 MG/ML) SOLN FOR INHALATION: 2.5 mg /3 mL (0.083 %) | RESPIRATORY_TRACT | Qty: 1

## 2021-02-14 MED FILL — PANTOPRAZOLE 40 MG IV SOLR: 40 mg | INTRAVENOUS | Qty: 40

## 2021-02-14 MED FILL — PREDNISONE 20 MG TAB: 20 mg | ORAL | Qty: 2

## 2021-02-14 MED FILL — LOVENOX 40 MG/0.4 ML SUBCUTANEOUS SYRINGE: 40 mg/0.4 mL | SUBCUTANEOUS | Qty: 0.4

## 2021-02-14 MED FILL — BUDESONIDE 1 MG/2 ML NEB SUSPENSION: 1 mg/2 mL | RESPIRATORY_TRACT | Qty: 1

## 2021-02-14 NOTE — Progress Notes (Signed)
Patient has transitional care follow up with [facility] on 02/20/2021 at 9:30 am.

## 2021-02-14 NOTE — Progress Notes (Signed)
Progress  Notes by Mellody Memos, MD at 02/14/21 1047                Author: Mellody Memos, MD  Service: Pulmonary Disease  Author Type: Physician       Filed: 02/14/21 2221  Date of Service: 02/14/21 1047  Status: Signed          Editor: Mellody Memos, MD (Physician)               [facility].   Pulmonary, Critical Care, and Sleep Medicine      F/U Pulmonary Consult           Name:  Dolora Bryant-Walker  MRN:  [id number]          DOB:  Dec 04, 1995  Hospital:  [facility]          Date:  02/14/2021                This patient has been seen and evaluated at the request of Dr. Bernadene Bell for asthma exacerbation and neck swelling.         IMPRESSION:     ??    Asthma exacerbation with severe persistent asthma (poorly controlled):  Triggered by rhinovirus.  Patient has severe persistent asthma  at baseline (nightly nocturnal awakenings), only on SABA.  Had stopped ICS/LAMA/LABA combination therapy at the beginning of her pregnancy approximately 15 months ago at recommendation of her obstetrician.   ??  Viral LRTI: Secondary to rhinovirus   ??  Extensive pneumomediastinum with subcutaneous emphysema: Secondary to positive pressure ventilation in the setting of above --occurred on 02/11/2021   ??  Marijuana and tobacco dependence:  Smokes blunts once per week   ??  Insurance coverage issues: Patient reports she recently lost Medicaid coverage   ??  Severe obesity: Body mass index is 39.79 kg/m??.           Patient Active Problem List      Diagnosis  Code      ?  Acute asthma exacerbation  J45.901      ?  Rhinovirus infection  B34.8      ?  Acute bronchitis  J20.9      ?  Acute respiratory failure with hypoxia (HCC)  J96.01      ?  Obesity (BMI 30.0-34.9)  E66.9               RECOMMENDATIONS:     ??    AVOID BiPAP and avoid positive pressure ventilation.  Currently patient has not had progression.  Ok to discharge   ??  Advise prednisone taper 4m x4d, 343mx4d, 2042m 4d, then 7m54md   ??  PPI  while on steroids   ??  Continue scheduled bronchodilators: duonebs q4h, pulmicort nebs 1mg 12m, and albuterol PRN   ??  At discharge, patient will need ICS/LABA/LAMA, advised Symbicort 160 and Spiriva HandiHaler.  Counseled patient to reapply for Medicaid   ??  Supplemental oxygen to maintain SpO2 >88%.  Use high flow nasal cannula while severely dyspneic.  May consider heliox if dyspnea worsens   ??  Follow-up total IgE, regional allergy panel   ??  Please assess for home oxygen need prior to discharge -- passed walk for desat today.  Will continue to improve over the next few days   ??  Aggressive pulmonary toileting/bronchial hygiene   ??  Frequent incentive spirometry   ??  Aspiration precautions including elevating HOB >30deg   ??  PT/OT, OOB, ambulate with assistance as tolerated   ??  DVT ppx per primary service   ??  Counseled patient on avoidance of asthma triggers   ??  Counseled regarding marijuana cessation         F/U in pulm clinic in 3-6 weeks post discharge.  Phone number to clinic given to pt             Subjective:     02/14/21   Patient seen and examined at bedside.  No acute events overnight.  Patient reports dyspnea slowly improving.  Patient reports ambulating without hypoxia today.  Oxygen was not approved overnight, so patient requesting to go home.  Has wheezing, but denies  any nausea, vomiting, diarrhea, chest pain.  Also reports neck swelling is stable.        HPI:   Patient is a 26 y.o. female with a past medical history of asthma and marijuana dependence, presented to [facility] with acute onset of shortness of breath.  Patient reports that symptoms started a few hours prior to presentation.  Patient  reports that she was taking care of her sick daughter, then developed symptoms of acute wheezing and shortness of breath, took nebulizer, slept, symptoms did not abate, took additional nebulizer treatment, no improvement, so patient presented to [facility].  While there,  patient was treated for asthma exacerbation, placed on BiPAP, overnight developed neck swelling, this morning stopped, CT neck performed.  Patient receiving steroids and bronchodilators.  With regards to her asthma, patient  reports she was diagnosed around age 34, was on controller inhalers most recently Trelegy until she became pregnant, discontinued by her obstetrician (child is now 22 months old).  Patient reports that she only uses as needed albuterol about once a week  prior to becoming sick, however has nightly nocturnal awakenings.  Reports smoking 1 blunt once a week, marijuana.  Patient reports no occupational exposures to coal/silica/asbestos.  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   Patient denies nausea, vomiting, diarrhea, hemoptysis.           Past Medical History:        Diagnosis  Date         ?  Asthma           No past surgical history on file.      Prior to Admission medications             Medication  Sig  Start Date  End Date  Taking?  Authorizing Provider            L. acidophilus,casei,rhamnosus (Bio-K plus) 50 billion cell cpDR capsule  Take 1 Capsule by mouth daily for 8 days.  02/14/21  02/22/21  Yes  Schley, Astrid Divine, MD     albuterol-ipratropium (DUO-NEB) 2.5 mg-0.5 mg/3 ml nebu  3 mL by Nebulization route every four (4) hours as needed for Wheezing.  02/14/21    Yes  Schley, Astrid Divine, MD     budesonide-formoteroL (Symbicort) 160-4.5 mcg/actuation HFAA  Take 2 Puffs by inhalation two (2) times a day.  02/13/21    Yes  Mitchell-Holston, [place], NP     tiotropium bromide (Spiriva Respimat) 2.5 mcg/actuation inhaler  Take 2 Puffs by inhalation daily.  02/13/21    Yes  Mitchell-Holston, [place], NP     montelukast (SINGULAIR) 10 mg tablet  Take 1 Tablet by mouth nightly.  02/13/21    Yes  Fowler, [place],  NP            predniSONE (DELTASONE) 20 mg tablet  Take 2 Tablets by mouth daily (with breakfast) for 4 days, THEN 1.5 Tablets daily (with breakfast) for 4 days, THEN 1 Tablet  daily (with breakfast)  for 4 days, THEN 0.5 Tablets daily (with breakfast) for 4 days.  02/14/21  03/02/21  Yes  Mitchell-Holston, [place], NP            albuterol (PROVENTIL HFA, VENTOLIN HFA, PROAIR HFA) 90 mcg/actuation inhaler  Take 1-2 Puffs by inhalation every four (4) hours as needed for Wheezing.  02/13/21    Yes  Mitchell-Holston, [place], NP     famotidine (PEPCID) 40 mg tablet  Take 1 Tablet by mouth daily.  02/13/21    Yes  Mitchell-Holston, [place], NP     amoxicillin-clavulanate (Augmentin) 875-125 mg per tablet  Take 1 Tablet by mouth every twelve (12) hours for 5 days.  02/13/21  02/18/21  Yes  Mitchell-Holston, [place], NP            albuterol (PROVENTIL VENTOLIN) 2.5 mg /3 mL (0.083 %) nebu  Take 2.5 mg by inhalation every four (4) hours as needed.  06/27/20      Other, Phys, MD          Allergies        Allergen  Reactions         ?  Sulfa (Sulfonamide Antibiotics)  Hives           Social History          Tobacco Use         ?  Smoking status:  Never     ?  Smokeless tobacco:  Never       Substance Use Topics         ?  Alcohol use:  Not Currently         No family history on file.         Current Facility-Administered Medications          Medication  Dose  Route  Frequency           ?  predniSONE (DELTASONE) tablet 40 mg   40 mg  Oral  DAILY WITH BREAKFAST     ?  pantoprazole (PROTONIX) 40 mg in 0.9% sodium chloride 10 mL injection   40 mg  IntraVENous  DAILY     ?  insulin lispro (HUMALOG) injection     SubCUTAneous  AC&HS     ?  budesonide (PULMICORT) 1,000 mcg/2 mL nebulizer susp   1,000 mcg  Nebulization  BID RT     ?  albuterol/ipratropium (DUONEB) neb solution   1 Dose  Nebulization  Q4H RT     ?  cefTRIAXone (ROCEPHIN) 1 g in sterile water (preservative free) 10 mL IV syringe   1 g  IntraVENous  Q24H     ?  azithromycin (ZITHROMAX) 500 mg in 0.9% sodium chloride 250 mL (VIAL-MATE)   500 mg  IntraVENous  Q24H     ?  sodium chloride (NS) flush 5-40 mL   5-40 mL  IntraVENous  Q8H     ?  enoxaparin (LOVENOX)  injection 40 mg   40 mg  SubCUTAneous  DAILY           ?  montelukast (SINGULAIR) tablet 10 mg   10 mg  Oral  QHS           Review of  Systems:   A comprehensive ROS was obtained as stated in HPI, all others are negative           Objective:     Vital Signs:       Visit Vitals   BP  116/76 (BP 1 Location: Left upper arm, BP Patient Position: At rest)      Pulse  (!) 112      Temp  97.9 ??F (36.6 ??C)      Resp  19      Ht  '5\' 1"'$  (1.549 m)      Wt  95.5 kg (210 lb 9.6 oz)      LMP  01/21/2021      SpO2  96%      Breastfeeding  No      BMI  39.79 kg/m??                O2 Device: Nasal cannula     O2 Flow Rate (L/min): 2 l/min       Temp (24hrs), Avg:98.3 ??F (36.8 ??C), Min:97.5 ??F (36.4 ??C), Max:99.1 ??F (37.3 ??C)           Intake/Output:    Last shift:      No intake/output data recorded.   Last 3 shifts: 01/31 1901 - 02/02 0700   In: -    Out: 1000 [Urine:1000]   No intake or output data in the 24 hours ending 02/14/21 1047       Physical Exam:       General:   Alert, cooperative, no distress, appears stated age, obese, sitting up in bed        Head:   Normocephalic, without obvious abnormality, atraumatic.     Eyes:   Conjunctivae/corneas clear.ANicteric, no ocular drainage        Nose:  Nares normal. Mucosa normal. No drainage or sinus tenderness.        Throat:  Lips, mucosa dry. NO thrush; good dentition, no oral lesions        Neck:  Supple, symmetrical, trachea midline, thick with crepitus on palpation, no adenopathy, thyroid: no enlargment/tenderness/nodules, no carotid bruit and no JVD.   Crepitus present due to subcu emphysema, stable        Back:    Symmetric, no curvature, no spine tenderness or flank pain     Lungs:    Fair air entry throughout all lung fields, scattered wheezes throughout all lung fields        Chest wall:   No tenderness or deformity. NO CREPITUS        Heart:   Regular rate and rhythm, S1, S2 normal, no murmur, click, rub or gallop.        Abdomen:    Soft, non-tender.Bowel sounds normal.  No masses,  No organomegaly. No paradoxical motion        Extremities:  normal, atraumatic, no cyanosis or edema.  No clubbing        Pulses:  1-2+ and symmetric all extremities.        Skin:  Skin color, texture, turgor normal. No rashes or lesions     Lymph nodes:  Cervical, supraclavicular, and axillary nodes normal.        Neurologic:  Grossly nonfocal, strength and coordination sensation grossly intact throughout extremities                Data review:     Labs:     Recent Results (from the past 24  hour(s))     GLUCOSE, POC          Collection Time: 02/13/21 11:53 AM         Result  Value  Ref Range            Glucose (POC)  98  70 - 110 mg/dL       GLUCOSE, POC          Collection Time: 02/13/21  9:25 PM         Result  Value  Ref Range            Glucose (POC)  96  70 - 110 mg/dL       CBC W/O DIFF          Collection Time: 02/14/21  3:00 AM         Result  Value  Ref Range            WBC  11.9  4.6 - 13.2 K/uL       RBC  4.58  4.20 - 5.30 M/uL       HGB  11.0 (L)  12.0 - 16.0 g/dL       HCT  34.9 (L)  35.0 - 45.0 %       MCV  76.2 (L)  78.0 - 100.0 FL       MCH  24.0  24.0 - 34.0 PG       MCHC  31.5  31.0 - 37.0 g/dL       RDW  17.9 (H)  11.6 - 14.5 %       PLATELET  373  135 - 420 K/uL       MPV  9.7  9.2 - 11.8 FL       NRBC  0.0  0 PER 100 WBC       ABSOLUTE NRBC  0.00  0.00 - 0.01 K/uL       METABOLIC PANEL, BASIC          Collection Time: 02/14/21  3:00 AM         Result  Value  Ref Range            Sodium  138  136 - 145 mmol/L            Potassium  4.3  3.5 - 5.5 mmol/L       Chloride  107  100 - 111 mmol/L       CO2  26  21 - 32 mmol/L       Anion gap  5  3.0 - 18 mmol/L       Glucose  77  74 - 99 mg/dL       BUN  23 (H)  7.0 - 18 MG/DL       Creatinine  0.86  0.6 - 1.3 MG/DL       BUN/Creatinine ratio  27 (H)  12 - 20         eGFR  >60  >60 ml/min/1.56m       Calcium  8.9  8.5 - 10.1 MG/DL       GLUCOSE, POC          Collection Time: 02/14/21  8:16 AM         Result  Value  Ref Range             Glucose (POC)  77  70 - 110 mg/dL        CT Results (  most recent):   Results from [place] encounter on 02/10/21      CT NECK SOFT TISSUE W CONT      Narrative   EXAMINATION: CT soft tissue neck with contrast      INDICATION: Acute neck swelling, asthma exacerbation      COMPARISON: None      TECHNIQUE: CT of the neck with IV contrast. All CT scans at this facility are   performed using dose optimization technique as appropriate to a performed exam,   to include automated exposure control, adjustment of the mA and/or kV according   to patient size (including appropriate matching first site specific   examinations), or use of iterative reconstruction technique.      FINDINGS:      Airways/mucosa/tonsils: Adenoids and tonsils unremarkable. Airways appear   largely patent. No suspicious mucosal lesions clearly identified.      Major salivary glands: Parotid glands and submandibular glands unremarkable.      Thyroid: Unremarkable.      Lymph nodes: No adenopathy by size criteria.      Vessels: Major vessels unremarkable.      Soft tissues: Extensive patchy soft tissue emphysema is notable in the left   greater than right lower neck extending into anterior chest and large volume   incompletely imaged pneumomediastinum. Globes are round and symmetric. No   suspicious rim-enhancing abscess identified.      Other: Left apical bandlike opacities, and minimal right apical groundglass   density. Paranasal sinuses and mastoids well-aerated. Imaged lower brain   unremarkable.      Bones: No acute osseous findings.      Impression   Extensive soft tissue emphysema neck extending into the chest, including   pneumomediastinum, which is new compared to recent prior chest CT. In the   absence of interval trauma, alveolar rupture with extension into the   pneumomediastinum and neck is the primary diagnostic consideration. No definite   pneumothorax identified in the upper chest. Consider repeat CT of the chest at   some  point if clinically warranted.      Bandlike opacities at the left apex, and groundglass density at right apex,   probably atelectasis. Recommend attention on follow-up imaging.        PFT Results  (Last 48 hours)             None                    Echo Results  (Last 48 hours)             None                  Imaging:   I have personally reviewed the patients radiographs and have reviewed the reports:   CT chest without contrast from 02/10/2021 shows mainly clear lung fields bilaterally with some mild linear atelectasis with pleural thickening laterally.  No masses, infiltrates, consolidations, effusions seen.  CT neck from 02/11/2021 shows extensive  subcutaneous emphysema in neck extending down into anterior and central mediastinum consistent with pneumomediastinum, causing some compressive atelectasis of left lung.   CXR Results  (Last 48 hours)             None                      CT Results  (Last 48 hours)             None  High complexity decision making was performed during the evaluation of this patient at high risk for decompensation with multiple organ involvement       Above mentioned total time spent on reviewing the case/medical record/data/notes/EMR/patient examination/documentation/coordinating care with nurse/consultants, exclusive of procedures with complex decision making performed and > 50% time spent in face  to face evaluation.                   Mellody Memos  MD/MPH      Pulmonary, Critical Care Medicine   [facility]

## 2021-02-14 NOTE — Discharge Summary (Signed)
Discharge Summary by Amparo Bristol, MD at 02/14/21 [phone]                Author: Amparo Bristol, MD  Service: Hospitalist  Author Type: Physician       Filed: 03/04/21 1752  Date of Service: 02/14/21 0947  Status: Signed          Editor: Amparo Bristol, MD (Physician)                  Discharge Summary          Patient: Stephanie Murray  MRN: [id number]   CSN: [id number]          Date of Birth: October 06, 1995   Age: 26 y.o.   Sex: female          DOA: 02/10/2021  LOS:  LOS: 4 days    Discharge Date:         Admission Diagnoses: Acute asthma exacerbation [J45.901]      Discharge Diagnoses:        Problem List as of 02/14/2021  Never Reviewed                           Codes  Class  Noted - Resolved             Acute asthma exacerbation  ICD-10-CM: J45.901   ICD-9-CM: 493.92    02/10/2021 - Present                       Rhinovirus infection  ICD-10-CM: B34.8   ICD-9-CM: 079.3    02/10/2021 - Present                       Acute bronchitis  ICD-10-CM: J20.9   ICD-9-CM: 466.0    02/10/2021 - Present                       Acute respiratory failure with hypoxia [facility])  ICD-10-CM: J96.01   ICD-9-CM: 518.81    02/10/2021 - Present                       Obesity (BMI 30.0-34.9)  ICD-10-CM: E66.9   ICD-9-CM: 278.00    02/10/2021 - Present                  Reason for Admission    26 y.o. female with a PMHx of asthma who presented to [facility] from [facility] for further management. Patient stated since 6 PM the prior evening she started having mild shortness of breath which was relieved  by PRN nebulizer, but over time her symptoms worsened. Since 6 AM that morning she used her nebulizer every one hour until 10 AM, which is when she decided to go to the hospital. Does not use oxygen at home. She has had episodes of asthma exacerbations  in the past with last time being in 2021. She also had a mild productive cough since one week. Upon examination, patient on 4L oxygen via NC satting at 92-94% with conversational dyspnea. Tachycardic,  but is post albuterol neb treatment. Denied smoking  cigarettes, drinking alcohol. Admitted to smoking marijuana once a week.          Discharge Condition: Good      PHYSICAL EXAM    Visit Vitals      BP  116/76 (BP 1 Location: Left upper  arm, BP Patient Position: At rest)     Pulse  (!) 112     Temp  97.9 ??F (36.6 ??C)     Resp  19     Ht  5' 1"  (1.549 m)     Wt  95.5 kg (210 lb 9.6 oz)     SpO2  96%     Breastfeeding  No        BMI  39.79 kg/m??           General:         Alert, cooperative, no acute distress     HEENT:           NC, Atraumatic.  PERRLA, anicteric sclerae.   Lungs:            wheeze t/o   Heart:              RRR, No murmur, No Rubs, No Gallops   Abdomen:      Soft, Non distended, Non tender.  +Bowel sounds, no HSM   Extremities:   No c/c/e   skin:              no rash to visible skin   Neurologic:     Alert and oriented X 4 .  No acute neurological deficits          Hospital Course:    ??  Asthma exacerbation improving, with severe persistent asthma (poorly controlled):  Triggered by rhinovirus.  Patient has severe persistent  asthma at baseline (nightly nocturnal awakenings), only on SABA.  Had stopped ICS/LAMA/LABA combination therapy at the beginning of her pregnancy approximately 15 months ago at recommendation of her obstetrician. Follow total IgE, regional allergy panel.  Discharge with prednisone taper 15m x4d, 327mx4d, 2078m 4d, then 9m68md. PPI while on steroids. At discharge, patient will need ICS/LABA/LAMA, advised Symbicort 160 and Spiriva HandiHale r. Counseled patient on avoidance of asthma triggers   ??  Viral LRTI: Secondary to rhinovirus   ??  Extensive pneumomediastinum with subcutaneous emphysema: Secondary to positive pressure ventilation in the setting of above --occurred on 02/11/2021. No further bipap utilized.   ??  Marijuana and tobacco dependence:  Smokes blunts once per week. Smoking cessation education done prior to discharge.    ??  Insurance coverage issues: Patient  reported she recently lost Medicaid coverage. Counseled patient to reapply for Medicaid.    ??  obesity: Body mass index is 36.28 kg/m??.    ??  Did not qualify for home oxygen prior to discharge.    ??  Patient is not homebound. Discharge to home w/ [facility] asthma. Patient agrees, all questions answered to the best of my ability.       Consults: Pulmonary/Critical Care      Significant Diagnostic Studies: labs:      Recent Results (from the past 24 hour(s))     GLUCOSE, POC          Collection Time: 02/13/21 11:53 AM         Result  Value  Ref Range            Glucose (POC)  98  70 - 110 mg/dL       GLUCOSE, POC          Collection Time: 02/13/21  9:25 PM         Result  Value  Ref Range            Glucose (  POC)  96  70 - 110 mg/dL       CBC W/O DIFF          Collection Time: 02/14/21  3:00 AM         Result  Value  Ref Range            WBC  11.9  4.6 - 13.2 K/uL            RBC  4.58  4.20 - 5.30 M/uL            HGB  11.0 (L)  12.0 - 16.0 g/dL       HCT  34.9 (L)  35.0 - 45.0 %       MCV  76.2 (L)  78.0 - 100.0 FL       MCH  24.0  24.0 - 34.0 PG       MCHC  31.5  31.0 - 37.0 g/dL       RDW  17.9 (H)  11.6 - 14.5 %       PLATELET  373  135 - 420 K/uL       MPV  9.7  9.2 - 11.8 FL       NRBC  0.0  0 PER 100 WBC       ABSOLUTE NRBC  0.00  0.00 - 0.01 K/uL       METABOLIC PANEL, BASIC          Collection Time: 02/14/21  3:00 AM         Result  Value  Ref Range            Sodium  138  136 - 145 mmol/L       Potassium  4.3  3.5 - 5.5 mmol/L       Chloride  107  100 - 111 mmol/L       CO2  26  21 - 32 mmol/L       Anion gap  5  3.0 - 18 mmol/L       Glucose  77  74 - 99 mg/dL       BUN  23 (H)  7.0 - 18 MG/DL       Creatinine  0.86  0.6 - 1.3 MG/DL       BUN/Creatinine ratio  27 (H)  12 - 20         eGFR  >60  >60 ml/min/1.63m       Calcium  8.9  8.5 - 10.1 MG/DL       GLUCOSE, POC          Collection Time: 02/14/21  8:16 AM         Result  Value  Ref Range            Glucose (POC)  77  70 - 110 mg/dL          Results                   Procedure  Component  Value  Units  Date/Time           CULTURE, RESPIRATORY/SPUTUM/BRONCH WVerdie Drown[[[id number]] Collected: 02/10/21 2345            Order Status: Canceled  Specimen: Sputum             CULTURE, BLOOD [[[id number]] Collected: 02/10/21 1930            Order Status: Completed  Specimen:  Blood  Updated: 02/14/21 0713                Special Requests:  NO SPECIAL REQUESTS              Culture result:  NO GROWTH 4 DAYS                CULTURE, BLOOD [[id number]]  Collected: 02/10/21 1930            Order Status: Completed  Specimen: Blood  Updated: 02/14/21 0713                Special Requests:  NO SPECIAL REQUESTS              Culture result:  NO GROWTH 4 DAYS                CULTURE, RESPIRATORY/SPUTUM/BRONCH Sid Falcon STAIN [[id number]]  Collected: 02/10/21 1857            Order Status: Completed  Specimen: Sputum  Updated: 02/13/21 0754                Special Requests:  NO SPECIAL REQUESTS              GRAM STAIN  1+ WBCS SEEN                                     OCCASIONAL EPITHELIAL CELLS SEEN                            NO ORGANISMS SEEN                    Culture result:                 MODERATE NORMAL RESPIRATORY FLORA                     RESPIRATORY VIRUS PANEL W/COVID-19, PCR [[id number]]  (Abnormal)  Collected: 02/10/21 1511            Order Status: Completed  Specimen: Nasopharyngeal  Updated: 02/10/21 1655                Adenovirus  Not detected              Coronavirus 229E  Not detected              Coronavirus HKU1  Not detected              Coronavirus CVNL63  Not detected              Coronavirus OC43  Not detected              SARS-CoV-2, PCR  Not detected              Metapneumovirus  Not detected              Rhinovirus and Enterovirus  Detected                Influenza A  Not detected              Influenza B  Not detected              Parainfluenza 1  Not detected              Parainfluenza 2  Not detected  Parainfluenza 3  Not detected              Parainfluenza  virus 4  Not detected              RSV by PCR  Not detected              B. parapertussis, PCR  Not detected                     Bordetella pertussis - PCR  Not detected                     Chlamydophila pneumoniae DNA, QL, PCR  Not detected              Mycoplasma pneumoniae DNA, QL, PCR  Not detected                INFLUENZA A & B AG (RAPID TEST) [[id number]]  Collected: 02/10/21 1215            Order Status: Completed  Specimen: Nasopharyngeal from Nasal washing  Updated: 02/10/21 1239                Influenza A Antigen  Negative                  Comment:  A negative result does not exclude influenza virus infection, seasonal or H1N1 due to suboptimal sensitivity. If influenza is circulating  in your community, a diagnosis of influenza should be considered based on a patients clinical presentation and empiric antiviral treatment should be considered, if indicated.                          Influenza B Antigen  Negative                COVID-19 RAPID TEST [[id number]]  Collected: 02/10/21 1215            Order Status: Completed  Specimen: Nasopharyngeal  Updated: 02/10/21 1246                Specimen source  Nasopharyngeal              COVID-19 rapid test  Not detected                  Comment:  Rapid Abbott ID Now         Rapid NAAT:  The specimen is NEGATIVE for SARS-CoV-2, the novel coronavirus associated with COVID-19.         Negative results should be treated as presumptive and, if inconsistent with clinical signs and symptoms or necessary for patient management, should be tested with an alternative molecular assay.   Negative results do not preclude SARS-CoV-2 infection and should not be used as the sole basis for patient management decisions.         This test has been authorized by the FDA under an Emergency Use Authorization (EUA) for use by authorized laboratories.    Fact sheet for Healthcare Providers: LittleDVDs.dk   Fact sheet for Patients:  SatelliteRebate.it         Methodology: Isothermal Nucleic Acid Amplification                               IMAGING   CT Results (most recent):   Results from Hospital Encounter encounter on 02/10/21      CT  NECK SOFT TISSUE W CONT      Narrative   EXAMINATION: CT soft tissue neck with contrast      INDICATION: Acute neck swelling, asthma exacerbation      COMPARISON: None      TECHNIQUE: CT of the neck with IV contrast. All CT scans at this facility are   performed using dose optimization technique as appropriate to a performed exam,   to include automated exposure control, adjustment of the mA and/or kV according   to patient size (including appropriate matching first site specific   examinations), or use of iterative reconstruction technique.      FINDINGS:      Airways/mucosa/tonsils: Adenoids and tonsils unremarkable. Airways appear   largely patent. No suspicious mucosal lesions clearly identified.      Major salivary glands: Parotid glands and submandibular glands unremarkable.      Thyroid: Unremarkable.      Lymph nodes: No adenopathy by size criteria.      Vessels: Major vessels unremarkable.      Soft tissues: Extensive patchy soft tissue emphysema is notable in the left   greater than right lower neck extending into anterior chest and large volume   incompletely imaged pneumomediastinum. Globes are round and symmetric. No   suspicious rim-enhancing abscess identified.      Other: Left apical bandlike opacities, and minimal right apical groundglass   density. Paranasal sinuses and mastoids well-aerated. Imaged lower brain   unremarkable.      Bones: No acute osseous findings.      Impression   Extensive soft tissue emphysema neck extending into the chest, including   pneumomediastinum, which is new compared to recent prior chest CT. In the   absence of interval trauma, alveolar rupture with extension into the   pneumomediastinum and neck is the primary diagnostic consideration. No  definite   pneumothorax identified in the upper chest. Consider repeat CT of the chest at   some point if clinically warranted.      Bandlike opacities at the left apex, and groundglass density at right apex,   probably atelectasis. Recommend attention on follow-up imaging.   XR Results (most recent):   Results from Hospital Encounter encounter on 02/10/21      XR CHEST PORT      Narrative   EXAM: CHEST  CPT CODE: 56387      CLINICAL INDICATION/HISTORY: Asthma attack; nebulizer treatment without relief.      COMPARISON: None.      TECHNIQUE: Single AP portable view of chest at 1213.      FINDINGS: There are clear lungs and sharp pleural margins.  The   cardiomediastinal silhouette is normal.  The bones and soft tissues are   unremarkable.      Impression   No acute pulmonic disease.        EKG Results                  Procedure  720  Value  Units  Date/Time           EKG, 12 LEAD, INITIAL [[id number]]  Collected: 02/10/21 2342       Order Status: Completed  Updated: 02/11/21 1454                Ventricular Rate  123  BPM           Atrial Rate  123  BPM           P-R Interval  130  ms           QRS Duration  84  ms           Q-T Interval  290  ms           QTC Calculation (Bezet)  415  ms           Calculated P Axis  62  degrees           Calculated R Axis  52  degrees           Calculated T Axis  28  degrees                Diagnosis  --             Sinus tachycardia   Otherwise normal ECG   No previous ECGs available   Confirmed by Gerre Scull, MD, ----- ([id number]) on 02/11/2021 2:54:35 PM              EKG, 12 LEAD, INITIAL [[id number]]             Order Status: Canceled                          Discharge Medications:        Current Discharge Medication List                 START taking these medications          Details        L. acidophilus,casei,rhamnosus (Bio-K plus) 50 billion cell cpDR capsule  Take 1 Capsule by mouth daily for 8 days.   Qty: 8 Capsule, Refills: 0               budesonide-formoteroL (Symbicort)  160-4.5 mcg/actuation HFAA  Take 2 Puffs by inhalation two (2) times a day.   Qty: 10.2 g, Refills: 0               tiotropium bromide (Spiriva Respimat) 2.5 mcg/actuation inhaler  Take 2 Puffs by inhalation daily.   Qty: 4 g, Refills: 0               montelukast (SINGULAIR) 10 mg tablet  Take 1 Tablet by mouth nightly.   Qty: 30 Tablet, Refills: 0               predniSONE (DELTASONE) 20 mg tablet  Take 2 Tablets by mouth daily (with breakfast) for 4 days, THEN 1.5 Tablets daily (with breakfast) for 4 days, THEN 1 Tablet daily (with breakfast) for 4 days,  THEN 0.5 Tablets daily (with breakfast) for 4 days.   Qty: 20 Tablet, Refills: 0               famotidine (PEPCID) 40 mg tablet  Take 1 Tablet by mouth daily.   Qty: 21 Tablet, Refills: 0               amoxicillin-clavulanate (Augmentin) 875-125 mg per tablet  Take 1 Tablet by mouth every twelve (12) hours for 5 days.   Qty: 10 Tablet, Refills: 0                        CONTINUE these medications which have CHANGED          Details        albuterol-ipratropium (DUO-NEB) 2.5 mg-0.5 mg/3 ml nebu  3 mL by Nebulization route every four (4) hours as  needed for Wheezing.   Qty: 90 Each, Refills: 0               albuterol (PROVENTIL HFA, VENTOLIN HFA, PROAIR HFA) 90 mcg/actuation inhaler  Take 1-2 Puffs by inhalation every four (4) hours as needed for Wheezing.   Qty: 18 g, Refills: 0                        CONTINUE these medications which have NOT CHANGED          Details        albuterol (PROVENTIL VENTOLIN) 2.5 mg /3 mL (0.083 %) nebu  Take 2.5 mg by inhalation every four (4) hours as needed.                        STOP taking these medications                  fluticasone/umeclidin/vilanter (TRELEGY ELLIPTA IN)  Comments:    Reason for Stopping:                      ipratropium (Atrovent HFA) 17 mcg/actuation inhaler  Comments:    Reason for Stopping:                             Activity: Activity as tolerated      Diet: Regular Diet      Wound Care: None needed       Follow-up:      Follow-up Appointments       Procedures        ?  FOLLOW UP VISIT Appointment in: Other (Specify) 1. Needs new pcp, close to [facility]. 2. Dr. Mellody Memos 4 - 6 weeks. Dx: severe persistent asthma,  hospital follow up.             1. Needs new pcp, close to [facility].   2. Dr. Mellody Memos 4 - 6 weeks. Dx: severe persistent asthma, hospital follow up.              Standing Status:    Standing         Number of Occurrences:    1         Order Specific Question:    Appointment in              Answer:    Other (Specify)              Minutes spent on discharge: >30

## 2021-02-14 NOTE — Progress Notes (Signed)
Patient performed walk test on room air ranging between 96-97% oxygen saturation. Patient tolerated well, no concerns or complaints.

## 2021-02-14 NOTE — Progress Notes (Signed)
Physician Progress Note      PATIENT:               Stephanie Murray, Stephanie Murray  CSN #:                  [id number]  DOB:                       07-20-1995  ADMIT DATE:       02/10/2021 11:06 AM  DISCH DATE:        02/14/2021 1:56 PM  RESPONDING  PROVIDER #:        Irving Burton MD Loretha Stapler MD          QUERY TEXT:    Pt admitted with Acute exacerbation of asthma with severe persistent asthma.  Pt noted to have possible super imposed pneumonia documented. If possible, please document in the progress notes and discharge summary if you are evaluating and/or treating any of the following:    The medical record reflects the following:  Risk Factors: 26 yo female with PMH severe persistent asthma  Clinical Indicators: Per H&P and Hospitalists notes: possible superimposed bacterial PNA  CT scan chest Minimal bronchitis/reactive airways with perhaps tiny areas of distal endobronchial mucus/infiltrate. No confluent consolidation.  CXR No acute pulmonic disease.  ? Treatment: CXR, CTA chest, IV antibiotics      Thank you for your time,    Rana Snare BSN, RN, [facility]  [address] [place], [place]. [address]  C: [phone]    [email or web address]  Options provided:  -- Pneumonia  -- Pneumonia ruled out  -- Other - I will add my own diagnosis  -- Disagree - Not applicable / Not valid  -- Disagree - Clinically unable to determine / Unknown  -- Refer to Clinical Documentation Reviewer    PROVIDER RESPONSE TEXT:    Viral LRTI.    Query created by: Rana Snare on 02/18/2021 10:34 AM      QUERY TEXT:    Patient admitted with asthma exacerbation w/rhinovirus and enterovirus. Documentation reflects acute respiratory failure with hypoxia in H&P.  If possible, please document in the progress notes and discharge summary if acute respiratory failure was:    The medical record reflects the following:  Risk Factors: 26 yo female with PMH asthma  Clinical Indicators: Per H&P Acute respiratory failure with hypoxia  Per H&P In  the ED she was found tachycardic tachypneic and hypoxia on room air.  70% saturation O2 when she ambulated on room air.  She was placed on Solu-Medrol nebulizer treatment and magnesium with minimum symptom resolution  1/29 ED RN note @ 1951 RR 46, O2 sats 94-96% on 4L NC. Provider and RT at bedside    Treatment: BIPAP, Solu-Medrol, Duo nebs, magnesium IV      Thank you for your time,    Rana Snare BSN, RN, [facility]  [address] [place], [place]. [address]  C: [phone]    [email or web address]  Options provided:  -- Acute respiratory failure confirmed after study  -- Acute respiratory failure ruled out after study  -- Other - I will add my own diagnosis  -- Disagree - Not applicable / Not valid  -- Disagree - Clinically unable to determine / Unknown  -- Refer to Clinical Documentation Reviewer    PROVIDER RESPONSE TEXT:    Acute respiratory failure ruled out after study.    Query created by: Rana Snare on 02/19/2021 7:53 AM      Electronically signed  by:  Emberlyn Burlison MD [facility] MD 02/19/2021 6:28 PM

## 2021-02-14 NOTE — Progress Notes (Signed)
D/C order noted for today. Orders reviewed. No needs identified at this time. CM remains available if needed.        Brittany Ziglar, MSW  Social Worker  [facility]

## 2021-02-16 LAB — ALLERGEN PROFILE, ZONE 2
Alternaria tenuis: 0.65 kU/L — AB
American Sycamore: 0.25 kU/L — AB
American White Elm: 2.29 kU/L — AB
Aspergillus fumigatus: 0.46 kU/L — AB
Bahia Grass: 0.19 kU/L — AB
Bermuda Grass: 0.13 kU/L — AB
CAT HAIR/DANDER,STANDARD, 069096: 12 kU/L — AB
Cat Hair/Dander: 12 kU/L — AB
Cladosporium herbarum: 0.31 kU/L — AB
Cockroach,American: <0.1 kU/L
D. farinae Mite: 4.49 kU/L — AB
D. pteronyssinus: 4.1 kU/L — AB
D001 D PTERONYSSINUS, 610725: 4.1 kU/L — AB
D002 D FARINAE MITE, 069211: 4.49 kU/L — AB
Dog Hair/Dander: 7.93 kU/L — AB
E005 DOG HAIR/DANDER, 061416: 7.93 kU/L — AB
G002 BERMUDA GRASS, 068791: 0.13 kU/L — AB
G010 JOHNSON GRASS, 068841: <0.1 kU/L
G017 BAHIA GRASS, 068783: 0.19 kU/L — AB
I100 COCKROACH,AMERICAN, 069187: <0.1 kU/L
Johnson Grass: <0.1 kU/L
M001 PENICILLIUM NOTATUM, 069302: <0.1 kU/L
M002 CLADOSPORIUM HERBARUM, 069286: 0.31 kU/L — AB
M003 ASPERGILLUS FUMIGATUS, 069260: 0.46 kU/L — AB
M004 MUCOR RACEMOSUS, 069294: <0.1 kU/L
M006 ALTERNARIA TENUIS, 069252: 0.65 kU/L — AB
M010 STEMPHYLIUM BOTRYOSUM, 650309: 0.49 kU/L — AB
Maple/Box Elder: 0.29 kU/L — AB
Mountain Cedar: 0.11 kU/L — AB
Mucor racemosus: <0.1 kU/L
Mugwort: <0.1 kU/L
Nettle: 0.38 kU/L — AB
Penicillium notatum: <0.1 kU/L
Pigweed, Rough: 1.11 kU/L — AB
Plantain, English: <0.1 kU/L
Ragweed, Short/Common: 0.8 kU/L — AB
SHEEP SORREL(DOCK), 069070: <0.1 kU/L
Sheep Sorrel (Dock): <0.1 kU/L
Stemphylium botryosum: 0.49 kU/L — AB
Sweet Gum: 0.5 kU/L — AB
T001 MAPLE/BOX ELDER, 068643: 0.29 kU/L — AB
T006 CEDAR, MOUNTAIN, 068650: 0.11 kU/L — AB
T007 OAK, WHITE, 068718: 10.8 kU/L — AB
T008 ELM, AMERICAN (WHITE), 068676: 2.29 kU/L — AB
T030 BIRCH, WHITE, 069336: 6.07 kU/L — AB
T041 HICKORY, WHITE, 069922: 3.74 kU/L — AB
T061-IGE SYCAMORE, AMERICAN, 068742: 0.25 kU/L — AB
T070 WHITE MULBERRY, 610949: <0.1 kU/L
T211-IGE SWEET GUM, 066910: 0.5 kU/L — AB
TIMOTHY GRASS, 068890: <0.1 kU/L
Timothy grass: <0.1 kU/L
W001 RAGWEED, SHORT/COMMON, 069013: 0.8 kU/L — AB
W006-IGE MUGWORT, 068981: <0.1 kU/L
W009 PLANTAIN, ENGLISH, 068940: <0.1 kU/L
W014 PIGWEED, ROUGH, 069005: 1.11 kU/L — AB
W020 NETTLE, 068999: 0.38 kU/L — AB
White Birch: 6.07 kU/L — AB
White Hickory: 3.74 kU/L — AB
White Mulberry: <0.1 kU/L
White Oak: 10.8 kU/L — AB

## 2021-02-16 LAB — CULTURE, BLOOD 1
Culture: NO GROWTH
Culture: NO GROWTH

## 2021-02-16 LAB — IMMUNOGLOBULIN E, QT
IMMUNOGLOBULIN E: 420 IU/mL (ref 6–495)
Immunoglobulin E: 420 IU/mL (ref 6–495)

## 2021-02-16 LAB — CULTURE, BLOOD
Culture result:: NO GROWTH
Culture result:: NO GROWTH

## 2021-02-19 ENCOUNTER — Encounter

## 2021-03-11 NOTE — Progress Notes (Signed)
Lf vm for pt to speak with Stephanie Murray in regs to a hosp fu apt with Dr. Abelina Bachelor in March w/CXR prior-order in system.

## 2021-03-15 NOTE — Telephone Encounter (Signed)
Pt. Calling to schedule a Hosptial  follow-up with Dr.H. Allena Katz.Be advised that there is a encounter note. Stated LVM for Stephanie Murray to open up New Patient In March. Please call [phone]

## 2021-04-03 ENCOUNTER — Encounter

## 2021-06-12 ENCOUNTER — Ambulatory Visit: Payer: PRIVATE HEALTH INSURANCE | Attending: Student in an Organized Health Care Education/Training Program

## 2021-07-10 NOTE — Telephone Encounter (Signed)
Spoke with Stephanie Murray to advise that at this time [facility] is not accepting new patients.

## 2021-07-10 NOTE — Telephone Encounter (Signed)
-----   Message from Ellouise Newer sent at 07/10/2021  2:27 PM EDT -----  Subject: Appointment Request    Reason for Call: New Patient/New to Provider Appointment needed: New   Patient Request Appointment    QUESTIONS    Reason for appointment request? No appointments available during search     Additional Information for Provider? Patient would like to be accepted as   a new patient if possible. Please call back and advise.   ---------------------------------------------------------------------------  --------------  Stephanie Murray INFO  [phone]; OK to leave message on voicemail  ---------------------------------------------------------------------------  --------------  SCRIPT ANSWERS

## 2021-07-12 ENCOUNTER — Encounter: Payer: PRIVATE HEALTH INSURANCE | Attending: Student in an Organized Health Care Education/Training Program

## 2021-08-01 ENCOUNTER — Inpatient Hospital Stay
Admit: 2021-08-01 | Discharge: 2021-08-01 | Disposition: A | Discharge status: Home / Self Care | Payer: PRIVATE HEALTH INSURANCE | Attending: Emergency Medicine

## 2021-08-01 DIAGNOSIS — J069 Acute upper respiratory infection, unspecified: Secondary | ICD-10-CM

## 2021-08-01 MED ORDER — ALBUTEROL SULFATE HFA 108 (90 BASE) MCG/ACT IN AERS
108 (90 Base) MCG/ACT | RESPIRATORY_TRACT | 2 refills | Status: AC | PRN
Start: 2021-08-01 — End: 2022-05-13

## 2021-08-01 MED ORDER — ALBUTEROL SULFATE (2.5 MG/3ML) 0.083% IN NEBU
RESPIRATORY_TRACT | 2 refills | Status: AC | PRN
Start: 2021-08-01 — End: ?

## 2021-08-01 MED ORDER — DEXAMETHASONE 4 MG PO TABS
4 MG | Freq: Once | ORAL | Status: AC
Start: 2021-08-01 — End: 2021-08-01
  Administered 2021-08-01: 12:00:00 10 mg via ORAL

## 2021-08-01 MED ORDER — BUDESONIDE-FORMOTEROL FUMARATE 160-4.5 MCG/ACT IN AERO
160-4.5 MCG/ACT | Freq: Two times a day (BID) | RESPIRATORY_TRACT | 2 refills | Status: AC
Start: 2021-08-01 — End: 2023-01-27

## 2021-08-01 MED ORDER — DEXAMETHASONE 6 MG PO TABS
6 MG | ORAL_TABLET | Freq: Once | ORAL | 0 refills | Status: AC
Start: 2021-08-01 — End: 2021-08-01

## 2021-08-01 MED FILL — DEXAMETHASONE 4 MG PO TABS: 4 MG | ORAL | Qty: 3

## 2021-08-01 NOTE — Discharge Instructions (Addendum)
Recommend extreme Tylenol every 4-6 hours and ibuprofen 600 mg every 6 hours as needed for pain and discomfort.  Stay hydrated.

## 2021-08-01 NOTE — ED Triage Notes (Signed)
States "I am out of my Albuterol Nebs and rescue inhaler and my Symbicort inhaler, could I get refills and a note for work?". States sinus congestion and mild asthma sx's x 5 days".

## 2021-08-01 NOTE — ED Notes (Signed)
Discharge instructions reviewed with patient.  Patient verbalized understanding.  Patient advised to follow up as directed on discharge instructions.  Patient denies questions, needs or concerns at this time.  Patient verbalized understanding. No s/sx of distress noted.        Alferd Patee, RN  08/01/21 [phone]

## 2021-08-01 NOTE — ED Provider Notes (Signed)
[facility]  EMERGENCY DEPARTMENT ENCOUNTER      Pt Name: Stephanie Murray  MRN: [id number]  Birthdate 1995-04-07  Date of evaluation: 08/01/2021  Provider: Innocence Schlotzhauer Festus Barren, MD    CHIEF COMPLAINT       Chief Complaint   Patient presents with    Medication Refill    Cough    Sinusitis         HISTORY OF PRESENT ILLNESS   (Location/Symptom, Timing/Onset, Context/Setting, Quality, Duration, Modifying Factors, Severity)  Note limiting factors.   Stephanie Murray is a 26 y.o. female who presents to the emergency department stating that her asthma is acting up.  She has run out of her inhalers at home and has been bothering her for the last couple days.  She states that since Tuesday she has been having headache and general malaise.  Her husband has been sick with a cold.  The headache is a bilateral frontal headache that comes and goes.  Resolves completely with medications.  No abrupt onset.  Has some minor congestion associated with it but no sore throat.  Believes she had a fever last night.  Denies any chest pain or shortness of breath right now but does feel her typical asthma tightness.  Feels like her typical asthma tightness.     Nursing Notes were reviewed.    REVIEW OF SYSTEMS    (2-9 systems for level 4, 10 or more for level 5)     Constitutional: Positive for fever  HENT: No ear pain  Eyes: No change in vision  Respiratory: Positive shortness of breath  Cardio: Positive for chest tightness  GI: No blood in stool  GU: No hematuria  MSK: No back pain  Skin: No rashes  Neuro: Positive for headache    Except as noted above the remainder of the review of systems was reviewed and negative.       PAST MEDICAL HISTORY     Past Medical History:   Diagnosis Date    Asthma          SURGICAL HISTORY     No past surgical history on file.      CURRENT MEDICATIONS       Previous Medications    IPRATROPIUM (ATROVENT HFA) 17 MCG/ACT INHALER    Inhale 1 puff into the lungs every 6 hours as needed     IPRATROPIUM-ALBUTEROL (DUONEB) 0.5-2.5 (3) MG/3ML SOLN NEBULIZER SOLUTION    Inhale 3 mLs into the lungs every 4 hours as needed       ALLERGIES     Sulfa antibiotics    FAMILY HISTORY     No family history on file.       SOCIAL HISTORY       Social History     Socioeconomic History    Marital status: Single   Tobacco Use    Smoking status: Never    Smokeless tobacco: Never   Substance and Sexual Activity    Alcohol use: Not Currently    Drug use: Never       SCREENINGS         Glasgow Coma Scale  Eye Opening: Spontaneous  Best Verbal Response: Oriented  Best Motor Response: Obeys commands  Glasgow Coma Scale Score: 15                     CIWA Assessment  BP: 99/63  Pulse: 92  PHYSICAL EXAM    (up to 7 for level 4, 8 or more for level 5)     ED Triage Vitals   BP Temp Temp Source Pulse Respirations SpO2 Height Weight - Scale   08/01/21 0623 08/01/21 7628 08/01/21 0712 08/01/21 0712 08/01/21 0712 08/01/21 0712 08/01/21 0713 08/01/21 0713   99/63 98.3 F (36.8 C) Oral 92 16 99 % 5\' 1"  (1.549 m) 213 lb (96.6 kg)       Physical Exam    General: No acute distress  Head: Normocephalic, atraumatic  Psych: Cooperative and alert  Eyes: No scleral icterus, normal conjunctiva, extract motion intact  ENT: Moist oral mucosa, no specific sinus tenderness or significant rhinorrhea  Neck: Supple, nonrigid  CV: Regular rate and rhythm, no pitting edema, palpable radial pulses  Pulm: Clear breath sounds bilaterally without any wheezing or rhonchi, normal respiratory rate  GI: Normal bowel sounds, soft, non-tender  MSK: Moves all four extremities  Skin: No rashes  Neuro: Alert and conversive, face is symmetrical, tongue protrudes midline, vision and hearing grossly intact, normal speech, 5/5 strength bilateral upper and lower extremities, no pronator drift, finger-to-nose cerebellar testing is within normal limits, no extinction, sensation grossly intact, can name common objects, alert and oriented  x4          DIAGNOSTIC RESULTS     No orders to display         ED BEDSIDE ULTRASOUND:   Performed by ED Physician - none    LABS:  Labs Reviewed - No data to display    All other labs were within normal range or not returned as of this dictation.    EMERGENCY DEPARTMENT COURSE and DIFFERENTIAL DIAGNOSIS/MDM:   Vitals:    Vitals:    08/01/21 0712 08/01/21 0713   BP: 99/63    Pulse: 92    Resp: 16    Temp: 98.3 F (36.8 C)    TempSrc: Oral    SpO2: 99%    Weight:  213 lb (96.6 kg)   Height:  5\' 1"  (1.549 m)       Medical Decision Making  Risk  Prescription drug management.      Patient is a 26 year old female presenting requesting refills as she feels like her asthma is acting up.  Patient does display signs of a viral URI.  However does not appear to be in any significant distress.  No concerning findings of acute bacterial infection at this time.  She states that this feels like her typical asthma and I do not hear obvious wheezing on exam but she also states that is not bothering her.  Does have a bronchospastic cough which does not bother process.  Do not suspect PE, dissection, ACS or pneumonia at this time.  Discussed different over-the-counter regimens to use to treat her symptoms.  She was treated with a dose dexamethasone here in the emergency department.  She will be given refills of all of her inhalers.  Advised to follow-up with primary care.    Patient stable for discharge at this time.  Patient is in agreement with the plan to be discharged at this time.  All the patient's questions were answered.  Patient was given written instructions on the diagnosis, and states understanding of the plan moving forward.  We did discuss important signs and symptoms that should prompt quick return to the emergency department.         CRITICAL CARE TIME   Total Critical Care time was  0 minutes, excluding separately reportable procedures.  There was a high probability of clinically significant/life threatening  deterioration in the patient's condition which required my urgent intervention.     CONSULTS:  None    PROCEDURES:  Unless otherwise noted below, none     Procedures      FINAL IMPRESSION      1. Viral URI with cough    2. Mild intermittent asthma with exacerbation          DISPOSITION/PLAN   DISPOSITION Decision To Discharge 08/01/2021 07:50:36 AM      PATIENT REFERRED TO:  Randel Books., APRN - NP  [address]  [address]  [place] [address]  [phone]    In 1 week      [facility]  [address]  [place] [address]  [phone]    As needed    [facility]  [address]  [place] [address]  [phone]    As needed      DISCHARGE MEDICATIONS:  New Prescriptions    DEXAMETHASONE (DECADRON) 6 MG TABLET    Take 2 tablets by mouth once for 1 dose Take in 48 to 72 hours if continued difficulty breathing.     Controlled Substances Monitoring:     No flowsheet data found.    (Please note that portions of this note were completed with a voice recognition program.  Efforts were made to edit the dictations but occasionally words are mis-transcribed.)    Kaleea Penner Festus Barren, MD (electronically signed)  Attending Emergency Physician           Gerrit Halls, MD  08/01/21 [phone]

## 2021-09-09 ENCOUNTER — Inpatient Hospital Stay
Admit: 2021-09-09 | Discharge: 2021-09-09 | Disposition: A | Discharge status: Home / Self Care | Payer: PRIVATE HEALTH INSURANCE | Attending: Emergency Medicine

## 2021-09-09 DIAGNOSIS — J45901 Unspecified asthma with (acute) exacerbation: Secondary | ICD-10-CM

## 2021-09-09 LAB — COVID-19, RAPID: SARS-CoV-2, Rapid: NOT DETECTED

## 2021-09-09 MED ORDER — PREDNISONE 20 MG PO TABS
20 MG | ORAL | Status: AC
Start: 2021-09-09 — End: 2021-09-09
  Administered 2021-09-09: 19:00:00 60 mg via ORAL

## 2021-09-09 MED ORDER — IPRATROPIUM-ALBUTEROL 0.5-2.5 (3) MG/3ML IN SOLN
0.5-2.5 (3) MG/3ML | RESPIRATORY_TRACT | Status: AC
Start: 2021-09-09 — End: 2021-09-09
  Administered 2021-09-09 (×2): 1 via RESPIRATORY_TRACT

## 2021-09-09 MED ORDER — METHYLPREDNISOLONE 4 MG PO TBPK
4 MG | PACK | ORAL | 0 refills | Status: AC
Start: 2021-09-09 — End: 2021-09-15

## 2021-09-09 MED FILL — IPRATROPIUM-ALBUTEROL 0.5-2.5 (3) MG/3ML IN SOLN: RESPIRATORY_TRACT | Qty: 3

## 2021-09-09 MED FILL — PREDNISONE 20 MG PO TABS: 20 MG | ORAL | Qty: 3

## 2021-09-09 NOTE — ED Triage Notes (Signed)
C/O cough, runny nose, congestion, hot sweats, needing inhaler more r/t wheezing. No difficulty breathing.

## 2021-09-09 NOTE — ED Provider Notes (Signed)
EMERGENCY DEPARTMENT HISTORY AND PHYSICAL EXAM    3:33 PM      Date: 09/09/2021  Patient Name: Stephanie Murray    History of Presenting Illness     Chief Complaint   Patient presents with    Wheezing    URI       History From: Patient  Patient is a 26 year old female with a history of asthma, bronchitis, obesity, on a long-acting inhaler at home, presents emergency department with complaint of shortness of breath increasing for the last 2 days.  Patient says she been wheezing, having hot flashes, and been short of breath.  The patient says that her inhalers are working but he is having to use them more frequently and is not completely resolving her symptoms.  Patient said that she has been having some chills and hot flashes and says that feels like she may have something more than just an asthma attack.  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  Patient is not a smoker, drinker, nor drug user.  Patient denies any leg swelling, and is not breast-feeding now.  Patient does not think she could be pregnant.           Nursing Notes were all reviewed and agreed with or any disagreements were addressed in the HPI.    PCP: None None    Current Facility-Administered Medications   Medication Dose Route Frequency Provider Last Rate Last Admin    ipratropium 0.5 mg-albuterol 2.5 mg (DUONEB) nebulizer solution 1 Dose  1 Dose Inhalation Q15 Min Alver Sorrow, MD   1 Dose at 09/09/21 1529     Current Outpatient Medications   Medication Sig Dispense Refill    budesonide-formoterol (SYMBICORT) 160-4.5 MCG/ACT AERO Inhale 2 puffs into the lungs 2 times daily 10.2 g 2    albuterol sulfate HFA (PROVENTIL;VENTOLIN;PROAIR) 108 (90 Base) MCG/ACT inhaler Inhale 2 puffs into the lungs every 4 hours as needed for Wheezing or Shortness of Breath 18 g 2    albuterol (PROVENTIL) (2.5 MG/3ML) 0.083% nebulizer solution Take 3 mLs by nebulization every 4 hours as needed for Wheezing or  Shortness of Breath 120 each 2    ipratropium (ATROVENT HFA) 17 MCG/ACT inhaler Inhale 1 puff into the lungs every 6 hours as needed (Patient not taking: Reported on 08/01/2021)      ipratropium-albuterol (DUONEB) 0.5-2.5 (3) MG/3ML SOLN nebulizer solution Inhale 3 mLs into the lungs every 4 hours as needed (Patient not taking: Reported on 08/01/2021)         Past History     Past Medical History:  Past Medical History:   Diagnosis Date    Asthma        Past Surgical History:  No past surgical history on file.    Family History:  No family history on file.    Social History:  Social History     Tobacco Use    Smoking status: Never    Smokeless tobacco: Never   Substance Use Topics    Alcohol use: Not Currently    Drug use: Never       Allergies:  Allergies   Allergen Reactions    Sulfa Antibiotics Hives         Review of Systems       Review of Systems   Constitutional:  Positive for chills and fever. Negative for activity change and fatigue.   HENT:  Negative for congestion.  Eyes: Negative.    Respiratory:  Positive for shortness of breath and wheezing. Negative for chest tightness.    Cardiovascular:  Negative for chest pain.   Gastrointestinal:  Negative for abdominal pain.   Genitourinary:  Negative for dysuria.   Musculoskeletal:  Negative for arthralgias.   Skin: Negative.    Neurological:  Negative for weakness.   Hematological: Negative.        Physical Exam   BP 99/74   Pulse 95   Temp 98.6 F (37 C) (Oral)   Resp 20   Ht 5\' 1"  (1.549 m)   Wt 204 lb (92.5 kg)   SpO2 100%   BMI 38.55 kg/m       Physical Exam  Vitals and nursing note reviewed.   Constitutional:       General: She is not in acute distress.     Appearance: Normal appearance.   HENT:      Head: Normocephalic and atraumatic.      Right Ear: External ear normal.      Left Ear: External ear normal.      Nose: Nose normal.      Mouth/Throat:      Mouth: Mucous membranes are moist.   Eyes:      Extraocular Movements: Extraocular movements  intact.      Pupils: Pupils are equal, round, and reactive to light.   Cardiovascular:      Rate and Rhythm: Normal rate.   Pulmonary:      Effort: Pulmonary effort is normal.      Breath sounds: Wheezing and rhonchi present.   Abdominal:      General: There is no distension.      Palpations: Abdomen is soft.   Musculoskeletal:         General: Tenderness present. Normal range of motion.   Skin:     General: Skin is warm and dry.      Capillary Refill: Capillary refill takes less than 2 seconds.   Neurological:      General: No focal deficit present.      Mental Status: She is alert and oriented to person, place, and time.   Psychiatric:         Mood and Affect: Mood normal.         Behavior: Behavior normal.      Comments: 73-year-old child at the bedside          Diagnostic Study Results     Labs -  No results found for this or any previous visit (from the past 12 hour(s)).    Radiologic Studies -   No orders to display         Medical Decision Making   I am the first provider for this patient.    I reviewed the vital signs, available nursing notes, past medical history, past surgical history, family history and social history.    Vital Signs-Reviewed the patient's vital signs.          ED Course: Progress Notes, Reevaluation, and Consults:    Provider Notes (Medical Decision Making):   MDM  Number of Diagnoses or Management Options  Exacerbation of asthma, unspecified asthma severity, unspecified whether persistent  Viral syndrome  Diagnosis management comments: Patient is a 26 year old female with a history of asthma, admission to the hospital, with a nebulizer at home as well as long-acting inhaler that presents with shortness of breath for the last 2 days that is medically better with her home  use of medications.  The patient is wheezing, is having chills at home and will follow rapid COVID test, DuoNebs, steroids, and then reevaluate.  PE is less likely at this time given that her clinical presentation is 1 of  like viral syndrome with an asthma exacerbation.         Patient's COVID test is reassuring and the patient has improved with supportive care.  Patient will be discharged home and will follow-up closely as an outpatient and return if at all worsened or concerned    Workup and recommendations were reviewed with the patient and all questions were answered.  The patient understands the plan and will proceed with close outpatient care.  I have encouraged the patient to return if at all worsened or concerned.   Myriam Jacobson, DO 3:17 PM      Patient was given the following medications:  Medications   ipratropium 0.5 mg-albuterol 2.5 mg (DUONEB) nebulizer solution 1 Dose (1 Dose Inhalation Given 09/09/21 1529)   predniSONE (DELTASONE) tablet 60 mg (60 mg Oral Given 09/09/21 1529)       CONSULTS: (Who and What was discussed)  None    Chronic Conditions: Asthma    Social Determinants affecting Dx or Tx: None    Records Reviewed (source and summary of external notes): Nursing Notes, Old Medical Records, Previous Radiology Studies, and Previous Laboratory Studies    Procedures          Diagnosis     Clinical Impression:   1. Exacerbation of asthma, unspecified asthma severity, unspecified whether persistent    2. Viral syndrome        Disposition: DC    Your Primary Provider    In 2 days      [facility]  [address] [place] [address]  [phone]    As needed, If symptoms worsen       Disclaimer: Sections of this note are dictated using utilizing voice recognition software.  Minor typographical errors may be present. If questions arise, please do not hesitate to contact me or call our department.       Alver Sorrow, MD  09/12/21 [phone]

## 2021-09-09 NOTE — ED Notes (Signed)
Discharge instructions reviewed with patient.  Patient verbalized understanding.  Patient advised to follow up as directed on discharge instructions.  Patient denies questions, needs or concerns at this time.  Patient verbalized understanding. No s/sx of distress noted.        Ulyess Blossom, RN  09/09/21 1651

## 2021-09-09 NOTE — Discharge Instructions (Signed)
Your COVID test was negative and suspect you have another virus that is causing your asthma to worsen.  Use your nebulizer or your albuterol inhaler every 4 hours for the next 2 days then as needed.  Take the steroids that been prescribed and return if you are at all worsened or concerned.

## 2021-09-09 NOTE — ED Notes (Signed)
Discharge instructions given to patient. Follow up information provided. Verbalized understanding.      Adele Barthel, RN  09/09/21 1651

## 2021-09-09 NOTE — ED Notes (Signed)
Pt states that she is feeling much better, breath sounds now clear without wheezing. Patient resting comfortably on the stretcher with call bell in reach.  Patient has no signs or symptoms of acute distress at this time.  Patient has no concerns or questions about their treatment plan.      Janet Berlin, RN  09/09/21 [phone]

## 2021-10-18 IMAGING — DX DG CHEST 1V PORT
1 series · 1 of 1 positions shown · non-contrast
Comparison: Chest radiograph 04/16/2019.

CLINICAL DATA: Cough, shortness of breath.

EXAM:
PORTABLE CHEST 1 VIEW

[chest ap]
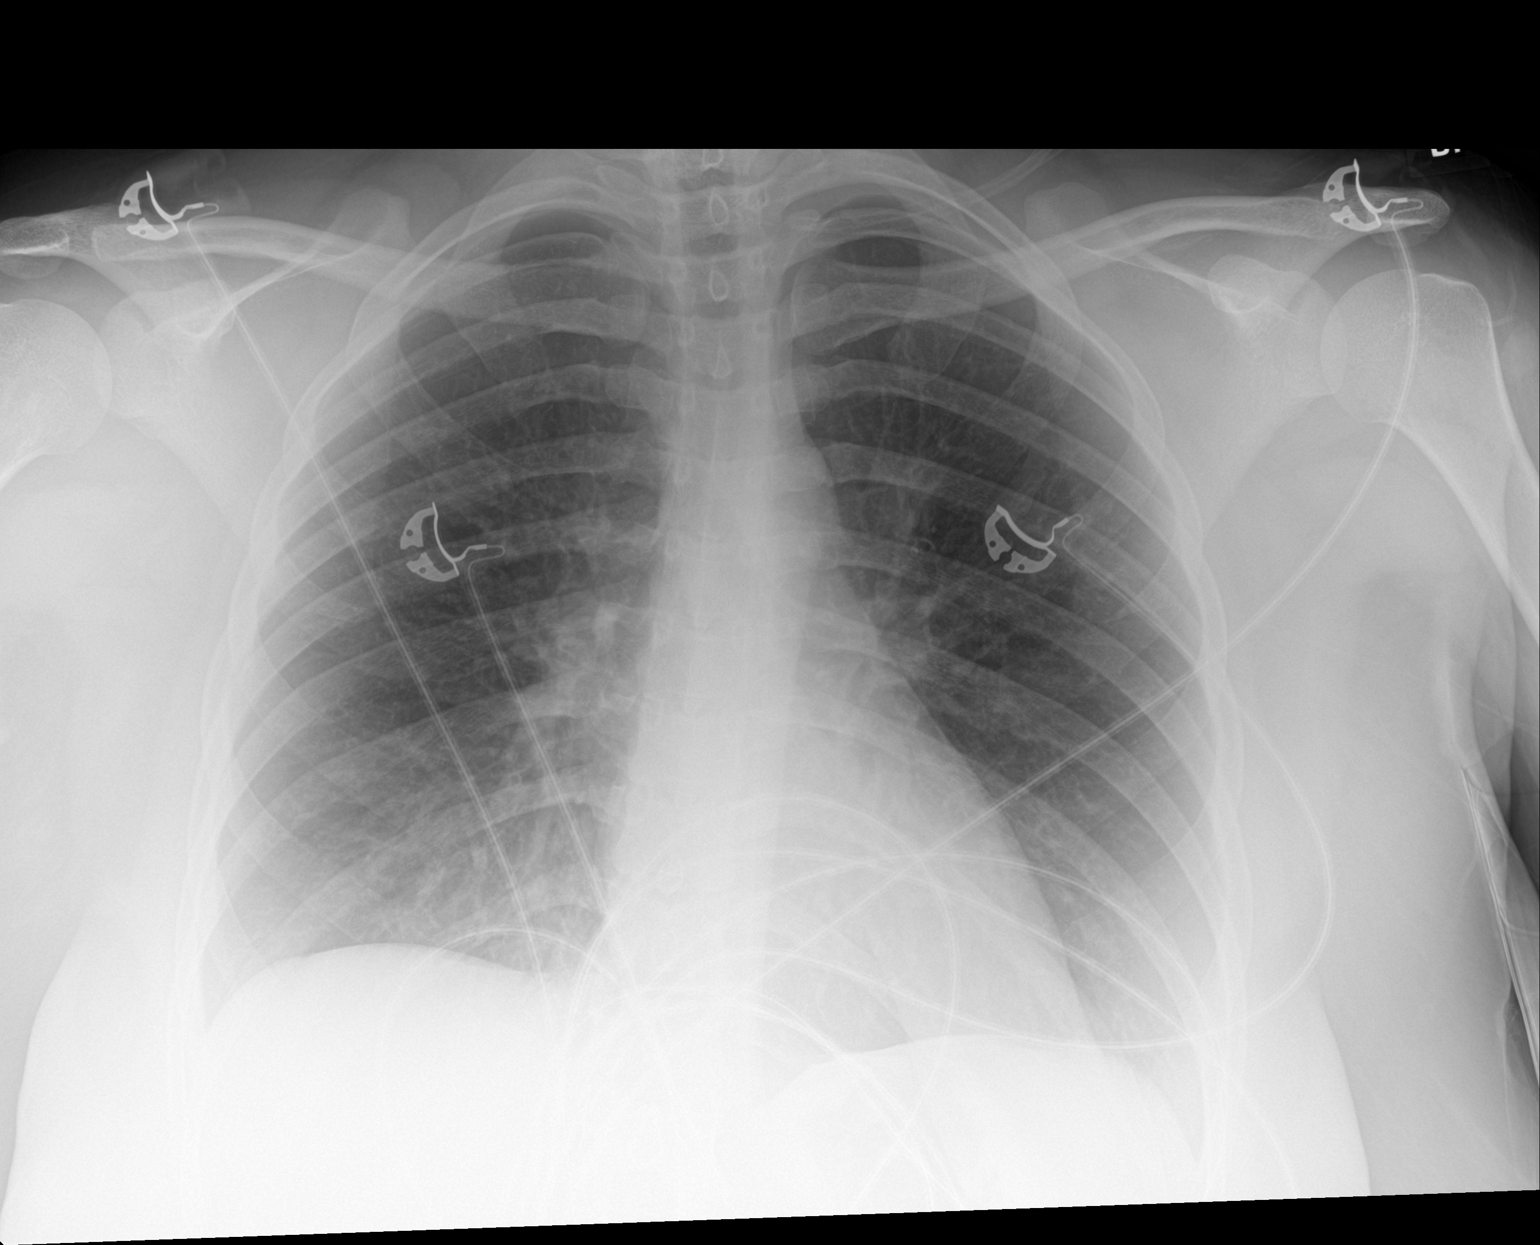

[1 of 1 positions shown; findings below may reference images not displayed]

FINDINGS: Heart size within normal limits. No evidence of airspace
consolidation within the lungs. No evidence of pleural effusion or
pneumothorax. No acute bony abnormality.
IMPRESSION: No evidence of acute cardiopulmonary abnormality.

## 2021-11-25 NOTE — ED Provider Notes (Signed)
Formatting of this note is different from the original.    [facility]    Time of Arrival:   11/25/21 4270    Final diagnoses:   [R05.1] Acute cough (Primary)   [Z87.09] History of asthma   [R09.81] Nasal congestion     Medical Decision Making:      Differential Diagnosis:   Viral syndrome, COVID, flu, sinusitis, pharyngitis, bronchitis, allergic rhinitis, asthma exacerbation.  Less clinically suspicious for pneumonia, strep pharyngitis, peritonsillar abscess, retropharyngeal abscess.    Social Determinants of Health:   social factors reviewed, did not limit treatment                               Supplemental Historians include:  patient    ED Course: Patient is a 26 year old female who presents to the ED for evaluation of cough, nasal congestion, body aches, and sore throat that is been ongoing for 3 days.    Not in any acute distress.  Vital signs stable.  Afebrile.    Heart has regular rate and rhythm.  Mild wheezing present in the lower lung fields.  Patient is speaking in full and clear sentences.  No respiratory distress noted.  Nasal congestion present.  Posterior oropharynx mildly erythematous.  No tonsillar exudates seen.    Tested for COVID and flu.  Counseled that these results be available in her MyChart.    Given dose of prednisone, DuoNeb, and albuterol nebulizer treatment.    Patient reports improvement of her symptoms after the medications.  Suspect patient has a viral upper respiratory infection.  Given prescription for Tessalon Perles, albuterol inhaler, prednisone, and Flonase.  Counseled on at home management of her symptoms.    All the patient's questions were sought and answered. Counseled on concerning signs and symptoms. Strict return to ED precautions given.   Discussed plan with patient who voices understanding and agrees.       As of 11/25/2021, 9:28 AM  The differential diagnosis and / or critical care lists were considered including infections, sepsis,  severe sepsis, and septic shock and found unlikely unless otherwise documented in the final clinical impression or diagnosis list.     Documentation/Prior Results Review:  Old medical records, Nursing notes, Previous radiology studies    Rhythm interpretation from monitor: N/A    Imaging Interpreted by me: Not Applicable    No orders to display     .     Discussion of Mangement with other Physicians, QHP or Appropriate Source:   None    .    Disposition:  Home    New Prescriptions    ALBUTEROL (PROVENTIL, VENTOLIN, PROAIR) 90 MCG/ACTUATION INH HFAA INHALER    Take 1-2 Puffs inhaled by mouth Every 6 hours.    BENZONATATE (TESSALON PERLES) 100 MG PO CAPS    Take 1 capsule 3 times daily as needed for coughing, swallow capsules whole.    FLUTICASONE PROPIONATE (FLONASE) 50 MCG/ACTUATION NA SPSN    1 Spray by each nostril route Once a Day.    PREDNISONE (DELTASONE) 20 MG PO TABS    Take 2 Tabs by Mouth Once a Day for 5 days.     Chief Complaint   Patient presents with    FLU LIKE SYMPTOMS     Patient is a 32 old female who presents to the ED for evaluation of cough, nasal congestion, body aches, chills, fever, and sore throat  that started 3 days ago.  She states the first day she had a fever of 101 F, but the fever has resolved.  She states the cough is intermittently productive of clear sputum.  Does have a history of asthma.  Has been having to use her albuterol inhaler more than normal.  Denies any recent sick contacts or recent travel.  Endorses a mild sore throat that has improved over the past couple of days.  Endorses nasal congestion.  Denies any other symptoms at this time.  Past medical history, medication list, and allergies reviewed.        Review of Systems    Physical Exam  Vitals and nursing note reviewed.   Constitutional:       Appearance: Normal appearance.   HENT:      Head: Normocephalic and atraumatic.      Right Ear: Tympanic membrane normal.      Left Ear: Tympanic membrane normal.      Nose:  Congestion present.      Mouth/Throat:      Mouth: Mucous membranes are moist.      Pharynx: Posterior oropharyngeal erythema present. No oropharyngeal exudate.   Eyes:      Conjunctiva/sclera: Conjunctivae normal.   Cardiovascular:      Rate and Rhythm: Normal rate and regular rhythm.      Pulses: Normal pulses.      Heart sounds: Normal heart sounds.   Pulmonary:      Effort: Pulmonary effort is normal.      Breath sounds: Wheezing present.   Abdominal:      General: Abdomen is flat.   Musculoskeletal:         General: Normal range of motion.      Cervical back: Normal range of motion.   Skin:     General: Skin is warm.   Neurological:      Mental Status: She is alert and oriented to person, place, and time. Mental status is at baseline.   Psychiatric:         Mood and Affect: Mood normal.     Past Medical History:   Diagnosis Date    Asthma      No past surgical history on file.  Family History   Problem Relation Age of Onset    Bipolar Disorder Mother     Hypertension Mother     Hypertension Father     Cancer Maternal Grandfather      Social History     Occupational History    Not on file   Tobacco Use    Smoking status: Never    Smokeless tobacco: Never   Vaping Use    Vaping Use: Never used   Substance and Sexual Activity    Alcohol use: Not Currently    Drug use: Never    Sexual activity: Not on file     Outpatient Medications Marked as Taking for the 11/25/21 encounter [facility] Encounter)   Medication Sig Dispense Refill    albuterol (PROVENTIL, VENTOLIN, PROAIR) 90 mcg/actuation INH HFAA inhaler Take 1-2 Puffs inhaled by mouth Every 6 hours. 18 g 0    benzonatate (TESSALON PERLES) 100 mg PO CAPS Take 1 capsule 3 times daily as needed for coughing, swallow capsules whole. 30 Cap 0    fluticasone propionate (FLONASE) 50 mcg/actuation NA SpSn 1 Spray by each nostril route Once a Day. 16 g 0    predniSONE (DELTASONE) 20 mg PO TABS Take 2 Tabs by Mouth  Once a Day for 5 days. 10 Tab 0     Allergies   Allergen  Reactions    Sulfa (Sulfonamide Antibiotics) hives     Vital Signs:  Patient Vitals for the past 72 hrs:   Temp Heart Rate Resp BP BP Mean SpO2 Weight   11/25/21 0807 -- -- -- -- -- -- 96.6 kg (213 lb)   11/25/21 0806 97.8 F (36.6 C) 89 17 134/67 89 MM HG 97 % --     Diagnostics:  Labs:  No results found for this visit on 11/25/21.  ECG:  No results found for this visit on 11/25/21.    Medications ordered/given in the ED  Medications   albuterol (ProventiL, Ventolin) 2.5 mg /3 mL (0.083 %) nebulizer solution 2.5 mg (2.5 mg Inhalation Given 11/25/21 0838)   ipratropium-albuteroL (DuoNeb) 0.5 mg-3 mg(2.5 mg base)/3 mL nebulizer solution 3 mL (3 mL Inhalation Given 11/25/21 0838)   predniSONE (Deltasone) tablet 60 mg (60 mg Oral Given 11/25/21 3244)     Evaluated and treated under the supervision of Dr. Beatris Ship.    Oneita Hurt, PA-C      Electronically signed by Dillard Cannon, MD at 11/25/2021  9:40 AM EST

## 2021-11-25 NOTE — ED Notes (Signed)
Formatting of this note might be different from the original.  Discharged by provider.   Electronically signed by Electa Sniff, RN at 11/25/2021  9:25 AM EST

## 2021-11-25 NOTE — ED Triage Notes (Signed)
Formatting of this note might be different from the original.  Patient reports headache, fever, cough, congestion for 2-3 days.   Electronically signed by Tish Frederickson, RN at 11/25/2021  8:06 AM EST

## 2021-11-25 NOTE — ED Notes (Signed)
Formatting of this note might be different from the original.  Covid swab obtained, labeled and sent to the lab. Patient tolerated procedure without complaint.      Medicated as per MAR. Allergies verified with the patient prior to administration. Education provided regarding rationale and side effects of medication. Patient verbalized understanding.    Electronically signed by Electa Sniff, RN at 11/25/2021  8:44 AM EST

## 2022-01-07 ENCOUNTER — Inpatient Hospital Stay
Admit: 2022-01-07 | Discharge: 2022-01-07 | Disposition: A | Discharge status: Home / Self Care | Payer: PRIVATE HEALTH INSURANCE | Attending: Student in an Organized Health Care Education/Training Program

## 2022-01-07 DIAGNOSIS — J069 Acute upper respiratory infection, unspecified: Secondary | ICD-10-CM

## 2022-01-07 LAB — RAPID INFLUENZA A/B ANTIGENS
Influenza A Ag: NEGATIVE
Influenza B Ag: NEGATIVE

## 2022-01-07 LAB — COVID-19, RAPID: SARS-CoV-2, Rapid: NOT DETECTED

## 2022-01-07 LAB — RAPID STREP SCREEN: Strep A Ag: NEGATIVE

## 2022-01-07 NOTE — ED Provider Notes (Signed)
[facility] DEPT  EMERGENCY DEPARTMENT ENCOUNTER       Pt Name: Stephanie Murray  MRN: [id number]  Birthdate [birth year]  Date of evaluation: 01/07/2022  Provider: Baxter Hire, MD   PCP: None, None  Note Started: 7:15 AM 01/07/22     CHIEF COMPLAINT       Chief Complaint   Patient presents with    Nasal Congestion    Sore Throat    Chills        HISTORY OF PRESENT ILLNESS: 1 or more elements      History From: Patient  None     Stephanie Murray is a 26 y.o. female who presents for 24 hours of sore throat, cough, diarrhea and generalized malaise. She also has several days of nasal congestion that until yesterday she had attributed to being outside in the cold weather. She denies shortness of breath, wheezing, productive cough, increased inhaler use. She denies nausea, vomiting, abdominal pain. Able to po hydrate well. Has received 1 COVID vaccine, no annual influenza shot.     Nursing Notes were all reviewed and agreed with or any disagreements were addressed in the HPI.     REVIEW OF SYSTEMS      Review of Systems     Positives and Pertinent negatives as per HPI.    PAST HISTORY     Past Medical History:  Past Medical History:   Diagnosis Date    Asthma          Past Surgical History:  No past surgical history on file.    Family History:  No family history on file.    Social History:  Social History     Tobacco Use    Smoking status: Never    Smokeless tobacco: Never   Substance Use Topics    Alcohol use: Not Currently    Drug use: Never       Allergies:  Allergies   Allergen Reactions    Sulfa Antibiotics Hives       CURRENT MEDICATIONS      Current Discharge Medication List        CONTINUE these medications which have NOT CHANGED    Details   budesonide-formoterol (SYMBICORT) 160-4.5 MCG/ACT AERO Inhale 2 puffs into the lungs 2 times daily  Qty: 10.2 g, Refills: 2      albuterol sulfate HFA (PROVENTIL;VENTOLIN;PROAIR) 108 (90 Base) MCG/ACT inhaler Inhale 2 puffs into the lungs every 4 hours as  needed for Wheezing or Shortness of Breath  Qty: 18 g, Refills: 2      albuterol (PROVENTIL) (2.5 MG/3ML) 0.083% nebulizer solution Take 3 mLs by nebulization every 4 hours as needed for Wheezing or Shortness of Breath  Qty: 120 each, Refills: 2      ipratropium (ATROVENT HFA) 17 MCG/ACT inhaler Inhale 1 puff into the lungs every 6 hours as needed      ipratropium-albuterol (DUONEB) 0.5-2.5 (3) MG/3ML SOLN nebulizer solution Inhale 3 mLs into the lungs every 4 hours as needed               PHYSICAL EXAM      ED Triage Vitals [01/07/22 0648]   Enc Vitals Group      BP 127/77      Pulse 100      Respirations 18      Temp 98.2 F (36.8 C)      Temp Source Oral      SpO2 100 %  Weight - Scale 96.6 kg (213 lb)      Height       Head Circumference       Peak Flow       Pain Score       Pain Loc       Pain Edu?       Excl. in GC?               Physical Exam  Vitals and nursing note reviewed.   Constitutional:       Appearance: Normal appearance.   HENT:      Head: Normocephalic and atraumatic.      Nose: Nose normal.      Mouth/Throat:      Mouth: Mucous membranes are moist.      Pharynx: Posterior oropharyngeal erythema present.   Eyes:      Conjunctiva/sclera: Conjunctivae normal.      Pupils: Pupils are equal, round, and reactive to light.   Cardiovascular:      Rate and Rhythm: Normal rate and regular rhythm.      Pulses: Normal pulses.      Heart sounds: Normal heart sounds.   Pulmonary:      Effort: Pulmonary effort is normal.      Breath sounds: Normal breath sounds.   Abdominal:      Palpations: Abdomen is soft.   Musculoskeletal:         General: Normal range of motion.   Lymphadenopathy:      Cervical: No cervical adenopathy.   Skin:     General: Skin is warm.      Capillary Refill: Capillary refill takes less than 2 seconds.   Neurological:      General: No focal deficit present.      Mental Status: She is alert and oriented to person, place, and time.            DIAGNOSTIC RESULTS   LABS:     Recent  Results (from the past 24 hour(s))   COVID-19, Rapid    Collection Time: 01/07/22  6:43 AM    Specimen: Nasopharyngeal   Result Value Ref Range    Source Nasopharyngeal      SARS-CoV-2, Rapid Not detected NOTD     Rapid influenza A/B antigens    Collection Time: 01/07/22  6:43 AM    Specimen: Nasal Washing   Result Value Ref Range    Influenza A Ag Negative NEG      Influenza B Ag Negative NEG     Rapid Strep Screen    Collection Time: 01/07/22  6:43 AM    Specimen: Throat   Result Value Ref Range    Strep A Ag Negative           EKG: None     RADIOLOGY:  None       PROCEDURES   Unless otherwise noted below, none  Procedures       CRITICAL CARE TIME   None    SCREENINGS   NIH Stroke Score       Heart Score       Curb-65          EMERGENCY DEPARTMENT COURSE and DIFFERENTIAL DIAGNOSIS/MDM   Vitals:    Vitals:    01/07/22 0648   BP: 127/77   Pulse: 100   Resp: 18   Temp: 98.2 F (36.8 C)   TempSrc: Oral   SpO2: 100%   Weight: 96.6 kg (213 lb)  Patient was given the following medications:  Medications - No data to display    CONSULTS: (Who and What was discussed)  None      Chronic Conditions: mild intermittent asthma    Social Determinants affecting Dx or Tx: None    Records Reviewed (source and summary of external notes): Nursing Notes    CC/HPI Summary, DDx, ED Course, and Reassessment: 26yo female patient presents with symptoms suspicious for likely viral upper respiratory infection. Based on history and physical doubt sinusitis, strep throat. COVID and influenza tests pending. Do not suspect underlying cardiopulmonary process. I considered, but think unlikely, dangerous causes of this patient's symptoms to include ACS, CHF or COPD exacerbations, pneumonia, pneumothorax. Patient is nontoxic appearing and not in need of emergent medical intervention. Patient told to self isolate at home until symptoms subside for 72 hours. All questions answered and patient comfortable with plan for dc.     Disposition  Considerations (Tests not done, Shared Decision Making, Pt Expectation of Test or Tx.): None     FINAL IMPRESSION     1. Viral URI with cough          DISPOSITION/PLAN   DISPOSITION Decision To Discharge 01/07/2022 07:11:45 AM      Discharge Note: The patient is stable for discharge home. The signs, symptoms, diagnosis, and discharge instructions have been discussed, understanding conveyed, and agreed upon. The patient is to follow up as recommended or return to ER should their symptoms worsen.      PATIENT REFERRED TO:  @FUD @        DISCHARGE MEDICATIONS:     Medication List        CONTINUE taking these medications      * albuterol sulfate HFA 108 (90 Base) MCG/ACT inhaler  Commonly known as: PROVENTIL;VENTOLIN;PROAIR  Inhale 2 puffs into the lungs every 4 hours as needed for Wheezing or Shortness of Breath     * albuterol (2.5 MG/3ML) 0.083% nebulizer solution  Commonly known as: PROVENTIL  Take 3 mLs by nebulization every 4 hours as needed for Wheezing or Shortness of Breath     budesonide-formoterol 160-4.5 MCG/ACT Aero  Commonly known as: SYMBICORT  Inhale 2 puffs into the lungs 2 times daily     ipratropium 0.5 mg-albuterol 2.5 mg 0.5-2.5 (3) MG/3ML Soln nebulizer solution  Commonly known as: DUONEB     ipratropium 17 MCG/ACT inhaler  Commonly known as: ATROVENT HFA           * This list has 2 medication(s) that are the same as other medications prescribed for you. Read the directions carefully, and ask your doctor or other care provider to review them with you.                    DISCONTINUED MEDICATIONS:  Current Discharge Medication List          I am the Primary Clinician of Record.   Lupita Raider, MD (electronically signed)      (Please note that parts of this dictation were completed with voice recognition software. Quite often unanticipated grammatical, syntax, homophones, and other interpretive errors are inadvertently transcribed by the computer software. Please disregards these errors. Please  excuse any errors that have escaped final proofreading.)          Lupita Raider, MD  Resident  01/07/22 [phone]

## 2022-01-07 NOTE — Discharge Instructions (Signed)
You were evaluated in the Emergency Department today for your congestion, cough and body aches. Your evaluation suggests that your symptoms are most likely due to a viral illness, which will improve on its own with rest and fluids. Please follow up the results of your influenza and COVID tests on your online patient portal.    We recommend you take 400mg  ibuprofen every 6 hours or tylenol 975mg  every 6 hours as needed for fever. If needed, you can alternate these medications so that you take one medication every 3 hours. For instance, at noon take ibuprofen, then at 3pm take tylenol, then at 6pm take ibuprofen.  Please schedule an appointment for follow up with your primary care physician this week.    Return to the Emergency Department if you experience worsening cough, fever 100.4  F or greater not controlled by Tylenol or Ibuprofen, recurrent vomiting, chest pain, shortness of breath, or any other concerning symptoms.

## 2022-01-07 NOTE — ED Triage Notes (Signed)
[name]/O x 4, presented to the ED with complaint of sore throat, nasal congestion, headache, diarrhea x 1 day.

## 2022-01-07 NOTE — ED Notes (Signed)
Condition stable.   Patient education was completed.  Patient education was taught to patient using discussion and handout.   Patient verbalized understanding.   Patient was discharged to home by self.  Patient was discharged ambulatory.

## 2022-01-09 LAB — CULTURE, THROAT: Culture: NORMAL

## 2022-04-13 NOTE — ED Provider Notes (Signed)
[facility] EMERGENCY DEPARTMENT    Time of Arrival:   04/13/22 1726        Final diagnoses:   [J45.901] Asthma with acute exacerbation, unspecified asthma severity, unspecified whether persistent (Primary)       Medical Decision Making:      Differential/Questionable Diagnosis:   asthma, pneumonia, pneumothorax, viral illness, allergies, PE    Social determinants: social factors reviewed, did not limit treatment                                              Supplemental Historians include:  patient     ED Course: 61 YOF here w/ wheezing, consistent w/ asthma exacerbation. No hypoxia, but has mild tachypnea, tachycardia, wheezing and increased WOB w/ prolonged expiratory phase.    Suspect tachycardia due to significant albuterol use. Do not suspect PE. Discussed w/ pt indication for blood work, however she would prefer to defer, which I think is very reasonable. Offered viral testing, which she declined.    Personal review of CXR w/o focal infiltrate    EKG nonacute, sinus tach    On reassessment, pt feels markedly improved.  Results of the work-up were imparted to the patient.  Very strict return precautions imparted.  Follow-up plan discussed.  Vitals are persistently tachycardic, no hypoxia.  All questions were sought and answered.  Appropriate for discharge.         As of 04/19/2022, 5:07 PM  The differential diagnosis and / or critical care lists were considered including infections, sepsis, severe sepsis, and septic shock and found unlikely unless otherwise documented in the final clinical impression or diagnosis list.         Documentation/Prior Results Review:    Old medical records:   , Nursing notes:       Rhythm interpretation from monitor: N/A    Imaging Interpreted by me: As documented in ED Course    CHEST PORTABLE   Final Result      No active cardiopulmonary disease.      Signed By: Ignacia Felling, MD on 04/13/2022 6:38 PM             .     Discussion of Management with other  Physicians, QHP or Appropriate Source:   None    .    Disposition:  Home    New Prescriptions    No medications on file     Chief Complaint   Patient presents with   . ASTHMA              HPI   This is a 27 year old female presenting with wheezing and shortness of breath.  Symptoms started 2 days ago.  She has a history of asthma and notes this happens every year around this time due to the pollen.  Coughing but no sputum production.  No fevers, no chest pain, no leg swelling, no recent travel, no history of VTE.       Physical Exam  Constitutional: Well appearing, in no acute distress.  HENT: Head is normocephalic and atraumatic.  Neck: Neck is supple.  Eyes: EOMI.  Cardiovascular: Well-perfused.  Tachycardic regular rhythm  Pulmonary: Tachypnea, increased work of breathing, wheezing  Abdomen: Non-distended.  Musculoskeletal: No extremity deformity.  No calf tenderness, no lower extremity edema  Skin: Dry. Normal color for ethnicity.  Neuro: Awake and  alert.  Oriented x 3, no focal neurologic deficit      Past Medical History:   Diagnosis Date   . Asthma      No past surgical history on file.  Family History   Problem Relation Age of Onset   . Bipolar Disorder Mother    . Hypertension Mother    . Hypertension Father    . Cancer Maternal Grandfather      Social History     Occupational History   . Not on file   Tobacco Use   . Smoking status: Never   . Smokeless tobacco: Never   Vaping Use   . Vaping status: Never Used   Substance and Sexual Activity   . Alcohol use: Not Currently   . Drug use: Never   . Sexual activity: Not on file     No outpatient medications have been marked as taking for the 04/13/22 encounter [facility] Encounter).     Allergies   Allergen Reactions   . Sulfa (Sulfonamide Antibiotics) hives       Vital Signs:  Patient Vitals for the past 72 hrs:   Temp Heart Rate Resp BP BP Mean SpO2 Weight   04/13/22 1729 97.4 F (36.3 C) 120 24 131/73 92 MM HG 97 % 97.9 kg (215 lb 12.8 oz)        Diagnostics:  Labs:  No results found for this visit on 04/13/22.  ECG:  No results found for this visit on 04/13/22.         Medications ordered/given in the ED  Medications   ipratropium-albuteroL (DuoNeb) 0.5 mg-3 mg(2.5 mg base)/3 mL nebulizer solution 3 mL (has no administration in time range)   predniSONE (Deltasone) tablet 60 mg (has no administration in time range)

## 2022-05-13 ENCOUNTER — Inpatient Hospital Stay
Admit: 2022-05-13 | Discharge: 2022-05-13 | Disposition: A | Discharge status: Home / Self Care | Payer: PRIVATE HEALTH INSURANCE | Attending: Student in an Organized Health Care Education/Training Program

## 2022-05-13 DIAGNOSIS — J069 Acute upper respiratory infection, unspecified: Secondary | ICD-10-CM

## 2022-05-13 LAB — COVID-19, RAPID: SARS-CoV-2, Rapid: NOT DETECTED

## 2022-05-13 LAB — RAPID INFLUENZA A/B ANTIGENS
Influenza A Ag: NEGATIVE
Influenza B Ag: NEGATIVE

## 2022-05-13 MED ORDER — METHYLPREDNISOLONE 4 MG PO TBPK
4 | PACK | ORAL | 0 refills | Status: AC
Start: 2022-05-13 — End: 2022-05-19

## 2022-05-13 MED ORDER — IPRATROPIUM-ALBUTEROL 0.5-2.5 (3) MG/3ML IN SOLN
0.5-2.5 (3) MG/3ML | RESPIRATORY_TRACT | Status: AC
Start: 2022-05-13 — End: 2022-05-13
  Administered 2022-05-13: 21:00:00 1 via RESPIRATORY_TRACT

## 2022-05-13 MED ORDER — ALBUTEROL SULFATE HFA 108 (90 BASE) MCG/ACT IN AERS
108 | Freq: Four times a day (QID) | RESPIRATORY_TRACT | 0 refills | Status: AC | PRN
Start: 2022-05-13 — End: ?

## 2022-05-13 NOTE — ED Triage Notes (Signed)
Pt states her nose and chest clogged up yesterday. Pt also has asthma. Has audible wheezing. Has used her inhaler and nebulizer without relief.

## 2022-05-13 NOTE — ED Provider Notes (Signed)
EMERGENCY DEPARTMENT HISTORY AND PHYSICAL EXAM    5:00 PM      Date: 05/13/2022  Patient Name: Stephanie Murray    History of Presenting Illness     Chief Complaint   Patient presents with    Nasal Congestion    Cough    Chills       History From: Patient  HPI  Patient is a 27yo F with h/o asthma presenting with congestion and wheezing. Symptoms began last night, noticed sweating, congestion, cough, and wheeze. Used her albuterol nebulizer multiple times overnight which helped but only somewhat. Has run out of her albuterol inhaler. Denies nausea, vomiting. Endorsing diarrhea. States son recently had URI symptoms similar to this. Compliant with her symbicort inhaler.      Nursing Notes were all reviewed and agreed with or any disagreements were addressed in the HPI.    PCP: None, None (Inactive)    No current facility-administered medications for this encounter.     Current Outpatient Medications   Medication Sig Dispense Refill    budesonide-formoterol (SYMBICORT) 160-4.5 MCG/ACT AERO Inhale 2 puffs into the lungs 2 times daily 10.2 g 2    albuterol sulfate HFA (PROVENTIL;VENTOLIN;PROAIR) 108 (90 Base) MCG/ACT inhaler Inhale 2 puffs into the lungs every 4 hours as needed for Wheezing or Shortness of Breath 18 g 2    albuterol (PROVENTIL) (2.5 MG/3ML) 0.083% nebulizer solution Take 3 mLs by nebulization every 4 hours as needed for Wheezing or Shortness of Breath 120 each 2    ipratropium (ATROVENT HFA) 17 MCG/ACT inhaler Inhale 1 puff into the lungs every 6 hours as needed (Patient not taking: Reported on 08/01/2021)         Past History     Past Medical History:  Past Medical History:   Diagnosis Date    Asthma        Past Surgical History:  History reviewed. No pertinent surgical history.    Family History:  History reviewed. No pertinent family history.    Social History:  Social History     Tobacco Use    Smoking status: Never     Passive exposure: Current    Smokeless tobacco: Never   Substance Use Topics     Alcohol use: Not Currently    Drug use: Yes     Types: Marijuana Sheran Fava)       Allergies:  Allergies   Allergen Reactions    Sulfa Antibiotics Hives         Review of Systems     Review of Systems   Constitutional:  Positive for diaphoresis. Negative for chills and fever.   HENT:  Positive for congestion and rhinorrhea. Negative for sinus pressure and sore throat.    Eyes:  Negative for pain and itching.   Respiratory:  Positive for cough, shortness of breath and wheezing.    Cardiovascular:  Negative for chest pain and palpitations.   Gastrointestinal:  Positive for diarrhea. Negative for abdominal pain, nausea and vomiting.   Genitourinary:  Negative for dysuria.   Musculoskeletal:  Negative for arthralgias and myalgias.   Skin:  Negative for rash and wound.   Neurological:  Negative for dizziness and headaches.   Psychiatric/Behavioral:  Negative for confusion.          Physical Exam   BP 123/69   Pulse (!) 110   Temp 98.2 F (36.8 C) (Oral)   Resp 22   Ht 1.537 m (5' 0.5")   Wt 96.6 kg (213  lb)   LMP 04/29/2022 (Approximate)   SpO2 96%   BMI 40.91 kg/m     Physical Exam  Constitutional:       Appearance: Normal appearance.   HENT:      Head: Normocephalic and atraumatic.      Nose: Congestion present.      Mouth/Throat:      Mouth: Mucous membranes are dry.      Pharynx: Oropharynx is clear. No oropharyngeal exudate or posterior oropharyngeal erythema.   Eyes:      Extraocular Movements: Extraocular movements intact.      Conjunctiva/sclera: Conjunctivae normal.      Pupils: Pupils are equal, round, and reactive to light.   Cardiovascular:      Rate and Rhythm: Normal rate and regular rhythm.      Heart sounds: No murmur heard.     No gallop.   Pulmonary:      Effort: Pulmonary effort is normal. No respiratory distress.      Breath sounds: Wheezing present. No rhonchi or rales.   Chest:      Chest wall: No tenderness.   Abdominal:      General: Abdomen is flat.      Palpations: Abdomen is soft.    Musculoskeletal:         General: Normal range of motion.      Cervical back: Normal range of motion and neck supple.   Skin:     General: Skin is warm and dry.      Capillary Refill: Capillary refill takes less than 2 seconds.   Neurological:      Mental Status: She is alert and oriented to person, place, and time.   Psychiatric:         Mood and Affect: Mood normal.         Behavior: Behavior normal.           Diagnostic Study Results     Labs -  No results found for this or any previous visit (from the past 12 hour(s)).    Radiologic Studies -   No orders to display         Medical Decision Making   I am the first provider for this patient.    I reviewed the vital signs, available nursing notes, past medical history, past surgical history, family history and social history.    Vital Signs-Reviewed the patient's vital signs.      EKG: none      ED Course: Progress Notes, Reevaluation, and Consults:    Provider Notes (Medical Decision Making):   MDM  Number of Diagnoses or Management Options  Diagnosis management comments: Mildly tachy, otherwise vitals wnl, saturating appropriately on room air  Wheezing on exam without crackles or rhonchi  Duoneb given, wheeze significantly improved    Symptoms including cough, congestion, diarrhea, sweating  Covid negative, flu negative    Likely acute asthma exacerbation in setting of viral URI  Will refill albuterol and send steroid taper to pharmacy   Supportive care with tylenol, ibuprofen PRN             Patient was given the following medications:  Medications   ipratropium 0.5 mg-albuterol 2.5 mg (DUONEB) nebulizer solution 1 Dose (1 Dose Inhalation Given 05/13/22 1641)       CONSULTS:   None    Chronic Conditions: asthma, obesity    Records Reviewed (source and summary of external notes): Old Medical Records and Previous Radiology Studies  Procedures: none    Is this patient to be included in the SEP-1 core measure? No Exclusion criteria - the patient is NOT to be  included for SEP-1 Core Measure due to: Infection is not suspected      Diagnosis     Clinical Impression: No diagnosis found.    Disposition: Discharge to home    No follow-up provider specified.     Disclaimer: Sections of this note are dictated using utilizing voice recognition software.  Minor typographical errors may be present. If questions arise, please do not hesitate to contact me or call our department.         Odette Fraction, DO  Resident  05/13/22 [phone]

## 2022-11-15 ENCOUNTER — Inpatient Hospital Stay
Admit: 2022-11-15 | Discharge: 2022-11-15 | Disposition: A | Discharge status: Home / Self Care | Payer: PRIVATE HEALTH INSURANCE | Attending: Emergency Medicine

## 2022-11-15 DIAGNOSIS — H1013 Acute atopic conjunctivitis, bilateral: Secondary | ICD-10-CM

## 2022-11-15 MED ORDER — CETIRIZINE HCL 10 MG PO TABS
10 MG | ORAL | Status: AC
Start: 2022-11-15 — End: 2022-11-15
  Administered 2022-11-15: 13:00:00 10 mg via ORAL

## 2022-11-15 MED ORDER — FAMOTIDINE 20 MG PO TABS
20 MG | ORAL | Status: AC
Start: 2022-11-15 — End: 2022-11-15
  Administered 2022-11-15: 13:00:00 20 mg via ORAL

## 2022-11-15 MED ORDER — PREDNISONE 20 MG PO TABS
20 MG | ORAL | Status: AC
Start: 2022-11-15 — End: 2022-11-15
  Administered 2022-11-15: 13:00:00 60 mg via ORAL

## 2022-11-15 MED ORDER — PREDNISONE 20 MG PO TABS
20 | ORAL_TABLET | Freq: Two times a day (BID) | ORAL | 0 refills | Status: AC
Start: 2022-11-15 — End: 2022-11-20

## 2022-11-15 MED FILL — CETIRIZINE HCL 10 MG PO TABS: 10 MG | ORAL | Qty: 1

## 2022-11-15 MED FILL — FAMOTIDINE 20 MG PO TABS: 20 MG | ORAL | Qty: 1

## 2022-11-15 MED FILL — PREDNISONE 20 MG PO TABS: 20 MG | ORAL | Qty: 3

## 2022-11-15 NOTE — ED Provider Notes (Signed)
[facility] DE[facility]  EMERGENCY DEPARTMENT ENCOUNTER      Pt Name: Stephanie Murray  MRN: [id number]  Birthdate 10/07/1995  Date of evaluation: 11/15/2022  Provider: Jerre Simon, MD  9:06 AM    CHIEF COMPLAINT       Chief Complaint   Patient presents with    Eye Problem         HISTORY OF PRESENT ILLNESS    Stephanie Murray is a 27 y.o. female who presents to the emergency department     27 year old female past medical history of asthma presents the emergency department with allergic reaction to bilateral eyes since Monday because she put fake eyelashes on.  Patient has no history of this.  No treatment with medications.  Vision is normal.  Bilateral eyes are red.  Itchy with clear discharge.  No other complaints.    The history is provided by the patient. No language interpreter was used.       Nursing Notes were reviewed.    REVIEW OF SYSTEMS       Review of Systems   Eyes:  Positive for discharge, redness and itching. Negative for photophobia, pain and visual disturbance.       Except as noted above the remainder of the review of systems was reviewed and negative.       PAST MEDICAL HISTORY     Past Medical History:   Diagnosis Date    Asthma          SURGICAL HISTORY     No past surgical history on file.      CURRENT MEDICATIONS       Current Discharge Medication List        CONTINUE these medications which have NOT CHANGED    Details   albuterol sulfate HFA (VENTOLIN HFA) 108 (90 Base) MCG/ACT inhaler Inhale 2 puffs into the lungs 4 times daily as needed for Wheezing  Qty: 18 g, Refills: 0      budesonide-formoterol (SYMBICORT) 160-4.5 MCG/ACT AERO Inhale 2 puffs into the lungs 2 times daily  Qty: 10.2 g, Refills: 2      albuterol (PROVENTIL) (2.5 MG/3ML) 0.083% nebulizer solution Take 3 mLs by nebulization every 4 hours as needed for Wheezing or Shortness of Breath  Qty: 120 each, Refills: 2      ipratropium (ATROVENT HFA) 17 MCG/ACT inhaler Inhale 1 puff into the lungs every 6 hours as needed              ALLERGIES     Sulfa antibiotics    FAMILY HISTORY     No family history on file.       SOCIAL HISTORY       Social History     Socioeconomic History    Marital status: Single   Tobacco Use    Smoking status: Never     Passive exposure: Current    Smokeless tobacco: Never   Substance and Sexual Activity    Alcohol use: Not Currently    Drug use: Yes     Types: Marijuana Sheran Fava)     Social Determinants of Health     Financial Resource Strain: Low Risk  (08/10/2019)    Received from [facility], [facility]    Overall Financial Resource Strain (CARDIA)     Difficulty of Paying Living Expenses: Not hard at all   Food Insecurity: No Food Insecurity (08/10/2019)    Received from [facility], [facility]  Hunger Vital Sign     Worried About Running Out of Food in the Last Year: Never true     Ran Out of Food in the Last Year: Never true   Transportation Needs: No Transportation Needs (08/10/2019)    Received from [facility], [facility]    [facility] - Transportation     Lack of Transportation (Medical): No     Lack of Transportation (Non-Medical): No   Intimate Partner Violence: Not At Risk (01/07/2020)    Received from [facility], [facility]    Humiliation, Afraid, Rape, and Kick questionnaire     Fear of Current or Ex-Partner: No     Emotionally Abused: No     Physically Abused: No     Sexually Abused: No       SCREENINGS         Glasgow Coma Scale  Eye Opening: Spontaneous  Best Verbal Response: Oriented  Best Motor Response: Obeys commands  Glasgow Coma Scale Score: 15                     CIWA Assessment  BP: 124/78  Pulse: 89                 PHYSICAL EXAM       ED Triage Vitals [11/15/22 0845]   BP Systolic BP Percentile Diastolic BP Percentile Temp Temp Source Pulse Respirations SpO2   124/78 -- -- 98.3 F (36.8 C) Oral 89 18 100 %      Height Weight - Scale         1.549 m (5\' 1" ) 104.3 kg (230 lb)             Physical Exam  Vitals and nursing note reviewed.   Constitutional:        Appearance: Normal appearance.   HENT:      Head: Normocephalic.   Neurological:      Mental Status: She is alert.         DIAGNOSTIC RESULTS     EKG: All EKG's are interpreted by the Emergency Department Physician who either signs or Co-signs this chart in the absence of a cardiologist.        RADIOLOGY:   Non-plain film images such as CT, Ultrasound and MRI are read by the radiologist. Plain radiographic images are visualized and preliminarily interpreted by the emergency physician with the below findings:        Interpretation per the Radiologist below, if available at the time of this note:    No orders to display         ED BEDSIDE ULTRASOUND:   Performed by ED Physician - none    LABS:  Labs Reviewed - No data to display    All other labs were within normal range or not returned as of this dictation.    EMERGENCY DEPARTMENT COURSE and DIFFERENTIAL DIAGNOSIS/MDM:   Vitals:    Vitals:    11/15/22 0845   BP: 124/78   Pulse: 89   Resp: 18   Temp: 98.3 F (36.8 C)   TempSrc: Oral   SpO2: 100%   Weight: 104.3 kg (230 lb)   Height: 1.549 m (5\' 1" )           Medical Decision Making  Patient looks like she has an allergic reaction.  Will give allergic cocktail.  No need to do fluorescein staining because there is no pain did not look like corneal  abrasion.  I would not give eyedrops because this does not seem bacterial it seems like an allergic reaction.  Patient is given prednisone Zyrtec and Pepcid to go home with.    Risk  OTC drugs.  Prescription drug management.            REASSESSMENT          CRITICAL CARE TIME       CONSULTS:  None    PROCEDURES:  Unless otherwise noted below, none     Procedures        FINAL IMPRESSION      1. Conjunctivitis, allergic, bilateral          DISPOSITION/PLAN   DISPOSITION Discharge - Pending Orders Complete 11/15/2022 08:53:22 AM           PATIENT REFERRED TO:  Kinnie Scales, MD  [address]  [address]  [place] [address]  [phone]    Call in 2  days        DISCHARGE MEDICATIONS:  Current Discharge Medication List        START taking these medications    Details   predniSONE (DELTASONE) 20 MG tablet Take 1 tablet by mouth 2 times daily for 5 days  Qty: 10 tablet, Refills: 0           Controlled Substances Monitoring:          No data to display                (Please note that portions of this note were completed with a voice recognition program.  Efforts were made to edit the dictations but occasionally words are mis-transcribed.)    Jerre Simon, MD (electronically signed)  Attending Emergency Physician            Jerre Simon, MD  11/15/22 [phone]

## 2022-11-15 NOTE — ED Triage Notes (Signed)
Patient arrived to ER with bilateral eye redness. States she had a reaction to the eyelashes she had placed 5 days ago. Initially redness was only on right eye and now they are both irritated and red.

## 2022-11-15 NOTE — ED Notes (Signed)
Visual Acuity     Bilateral Distance 20/15   R Distance 20/20   L Distance 20/20   Unable to [employer] (Comment) --

## 2022-11-15 NOTE — Discharge Instructions (Addendum)
Come back if you get worse.  Follow-up without fail.

## 2023-01-27 ENCOUNTER — Inpatient Hospital Stay
Admit: 2023-01-27 | Discharge: 2023-01-27 | Disposition: A | Discharge status: Home / Self Care | Payer: PRIVATE HEALTH INSURANCE | Attending: Emergency Medicine

## 2023-01-27 ENCOUNTER — Emergency Department: Admit: 2023-01-27 | Payer: PRIVATE HEALTH INSURANCE

## 2023-01-27 DIAGNOSIS — J4541 Moderate persistent asthma with (acute) exacerbation: Secondary | ICD-10-CM

## 2023-01-27 DIAGNOSIS — J069 Acute upper respiratory infection, unspecified: Secondary | ICD-10-CM

## 2023-01-27 LAB — EKG 12-LEAD
Atrial Rate: 99 {beats}/min
Diagnosis: NORMAL
P Axis: 55 degrees
P-R Interval: 138 ms
Q-T Interval: 336 ms
QRS Duration: 72 ms
QTc Calculation (Bazett): 431 ms
R Axis: 32 degrees
T Axis: 42 degrees
Ventricular Rate: 99 {beats}/min

## 2023-01-27 LAB — COVID-19 & INFLUENZA COMBO
Rapid Influenza A By PCR: NOT DETECTED
Rapid Influenza B By PCR: NOT DETECTED
SARS-CoV-2, PCR: NOT DETECTED

## 2023-01-27 MED ORDER — IPRATROPIUM-ALBUTEROL 0.5-2.5 (3) MG/3ML IN SOLN
0.5-2.5 | RESPIRATORY_TRACT | Status: AC
Start: 2023-01-27 — End: 2023-01-27
  Administered 2023-01-27: 12:00:00 1 via RESPIRATORY_TRACT

## 2023-01-27 MED ORDER — IPRATROPIUM-ALBUTEROL 0.5-2.5 (3) MG/3ML IN SOLN
0.5-2.5 (3) MG/3ML | RESPIRATORY_TRACT | Status: AC
Start: 2023-01-27 — End: 2023-01-27

## 2023-01-27 MED ORDER — BUDESONIDE-FORMOTEROL FUMARATE 160-4.5 MCG/ACT IN AERO
160-4.5 | Freq: Two times a day (BID) | RESPIRATORY_TRACT | 2 refills | Status: AC
Start: 2023-01-27 — End: ?

## 2023-01-27 MED ORDER — DEXAMETHASONE 4 MG PO TABS
4 | ORAL_TABLET | Freq: Once | ORAL | 0 refills | Status: AC | PRN
Start: 2023-01-27 — End: 2023-01-27

## 2023-01-27 MED ORDER — DEXAMETHASONE 4 MG PO TABS
4 | Freq: Once | ORAL | Status: AC
Start: 2023-01-27 — End: 2023-01-27
  Administered 2023-01-27: 12:00:00 10 mg via ORAL

## 2023-01-27 MED FILL — DEXAMETHASONE 4 MG PO TABS: 4 MG | ORAL | Qty: 3

## 2023-01-27 MED FILL — IPRATROPIUM-ALBUTEROL 0.5-2.5 (3) MG/3ML IN SOLN: 0.5-2.5 (3) MG/3ML | RESPIRATORY_TRACT | Qty: 6

## 2023-01-27 NOTE — ED Triage Notes (Signed)
Pt to ED reports wheezing and SOB onset this am around 0500. Hx of asthma   Pt arrived audible wheezing noted

## 2023-01-27 NOTE — ED Provider Notes (Addendum)
[facility] EMERGENCY DEPARTMENT  EMERGENCY DEPARTMENT ENCOUNTER      Pt Name: Stephanie Murray  MRN: [id number]  Birthdate 10-24-1995  Date of evaluation: 01/27/2023  Provider: Majour Frei Festus Barren, MD    CHIEF COMPLAINT       Chief Complaint   Patient presents with    Wheezing     Pt to ED reports wheezing and SOB onset this am around 0500. Hx of asthma          HISTORY OF PRESENT ILLNESS   (Location/Symptom, Timing/Onset, Context/Setting, Quality, Duration, Modifying Factors, Severity)  Note limiting factors.   Stephanie Murray is a 28 y.o. female who presents to the emergency department for multiple complaints.  States that her symptoms started yesterday with a sore throat.  She has a nonproductive cough.  Started to have shortness of breath last night.  Feels like her asthma is acting up.  Denies any actual chest pain.  Denies any fever.  Denies any swelling or lower extremities or history of blood clots.  States that her son was diagnosed with RSV and flu.  She tried using her inhaler but it did not help so she came to the emergency department.     Nursing Notes were reviewed.    REVIEW OF SYSTEMS    (2-9 systems for level 4, 10 or more for level 5)     Constitutional: No fever  HENT: No ear pain  Eyes: No change in vision  Respiratory: Positive for shortness of breath  Cardio: No chest pain  GI: No blood in stool  GU: No hematuria  MSK: No back pain  Skin: No rashes  Neuro: No headache    Except as noted above the remainder of the review of systems was reviewed and negative.       PAST MEDICAL HISTORY     Past Medical History:   Diagnosis Date    Asthma          SURGICAL HISTORY     No past surgical history on file.      CURRENT MEDICATIONS       Current Discharge Medication List        CONTINUE these medications which have NOT CHANGED    Details   albuterol sulfate HFA (VENTOLIN HFA) 108 (90 Base) MCG/ACT inhaler Inhale 2 puffs into the lungs 4 times daily as needed for Wheezing  Qty: 18 g,  Refills: 0      albuterol (PROVENTIL) (2.5 MG/3ML) 0.083% nebulizer solution Take 3 mLs by nebulization every 4 hours as needed for Wheezing or Shortness of Breath  Qty: 120 each, Refills: 2      ipratropium (ATROVENT HFA) 17 MCG/ACT inhaler Inhale 1 puff into the lungs every 6 hours as needed             ALLERGIES     Sulfa antibiotics    FAMILY HISTORY     No family history on file.       SOCIAL HISTORY       Social History     Socioeconomic History    Marital status: Single   Tobacco Use    Smoking status: Never     Passive exposure: Current    Smokeless tobacco: Never   Substance and Sexual Activity    Alcohol use: Not Currently    Drug use: Yes     Types: Marijuana Sheran Fava)     Social Determinants of Health     Financial Resource Strain: Low  Risk  (08/10/2019)    Received from [facility], [facility]    Overall Financial Resource Strain (CARDIA)     Difficulty of Paying Living Expenses: Not hard at all   Food Insecurity: No Food Insecurity (08/10/2019)    Received from [facility], [facility]    Hunger Vital Sign     Worried About Running Out of Food in the Last Year: Never true     Ran Out of Food in the Last Year: Never true   Transportation Needs: No Transportation Needs (08/10/2019)    Received from [facility], [facility]    [facility] - Transportation     Lack of Transportation (Medical): No     Lack of Transportation (Non-Medical): No   Intimate Partner Violence: Not At Risk (01/07/2020)    Received from [facility], [facility]    Humiliation, Afraid, Rape, and Kick questionnaire     Fear of Current or Ex-Partner: No     Emotionally Abused: No     Physically Abused: No     Sexually Abused: No       SCREENINGS         Glasgow Coma Scale  Eye Opening: Spontaneous  Best Verbal Response: Oriented  Best Motor Response: Obeys commands  Glasgow Coma Scale Score: 15                     CIWA Assessment  BP: 105/65  Pulse: (!) 105                 PHYSICAL EXAM    (up to 7 for level 4, 8  or more for level 5)     ED Triage Vitals 01/27/23 0651   BP Systolic BP Percentile Diastolic BP Percentile Temp Temp src Pulse Resp SpO2   -- -- -- -- -- -- -- --      Height Weight - Scale         1.524 m (5') 110.2 kg (243 lb)             Physical Exam    General: No acute distress  Head: Normocephalic, atraumatic  Psych: Cooperative and alert  Eyes: No scleral icterus, normal conjunctiva  ENT: Moist oral mucosa, tonsils are mildly swollen without exudate or significant erythema, uvula hangs midline, large taste buds on the back of the tongue  Neck: Supple  CV: Borderline tachycardic regular rhythm, no pitting edema  Pulm: Diffuse expiratory wheeze throughout, no obvious crackles  GI: Normal bowel sounds, soft, non-tender  MSK: Moves all four extremities  Skin: No rashes  Neuro: Alert and conversive      DIAGNOSTIC RESULTS     EKG: All EKG's are interpreted by the Emergency Department Physician who either signs or Co-signs this chart in the absence of a cardiologist.    EKG shows a normal sinus rhythm at a rate of 99 bpm.  QRS is narrow, axis is normal, R wave progression is pericardium is satisfactory.  No specific ST elevation or depression noted.  Decreased amplitude throughout.  Overall shows no signs of ACS or arrhythmia.    RADIOLOGY:   Non-plain film images such as CT, Ultrasound and MRI are read by the radiologist. Plain radiographic images are visualized and preliminarily interpreted by the emergency physician with the below findings:    Chest x-ray shows no acute cardiopulmonary process per my interpretation.    Interpretation per the Radiologist below,  if available at the time of this note:    XR CHEST (2 VW)   Final Result      No active cardiopulmonary disease.      Electronically signed by Lucianne Lei            ED BEDSIDE ULTRASOUND:   Performed by ED Physician - none    LABS:  Labs Reviewed   COVID-19 & INFLUENZA COMBO       All other labs were within normal range or not returned as of this  dictation.    EMERGENCY DEPARTMENT COURSE and DIFFERENTIAL DIAGNOSIS/MDM:   Vitals:    Vitals:    01/27/23 0651 01/27/23 0657   BP:  105/65   Pulse:  (!) 105   Resp:  22   SpO2:  99%   Weight: 110.2 kg (243 lb)    Height: 1.524 m (5')        Medical Decision Making  Risk  Prescription drug management.    Patient is a 28 year old female who presents with symptoms consistent with a viral URI with asthma exacerbation.  Patient has had recent exposure to flu and RSV and is likely suffering from mono either of these.  However does seem to also be asked exacerbating her asthma.  She will be treated with dexamethasone and DuoNebs to help with asthma exacerbation.  She will be tested for flu and COVID.  We will do a screening EKG and chest x-ray as she is short of breath however she is otherwise young and healthy and no concerns for ACS, PE, dissection or pneumonia at this time.    EKG and chest x-ray within normal limits.    On reexamination patient states that she is feeling better.This is the importance of using her inhaler.  She present to repeat dose of steroid to use if needed.  She request a refill of her Symbicort which is given.  She will be given a work note.    Patient stable for discharge at this time.  Patient is in agreement with the plan to be discharged at this time.  All the patient's questions were answered.  Patient was given written instructions on the diagnosis, and states understanding of the plan moving forward.  We did discuss important signs and symptoms that should prompt quick return to the emergency department.         CRITICAL CARE TIME   Total Critical Care time was 0 minutes, excluding separately reportable procedures.  There was a high probability of clinically significant/life threatening deterioration in the patient's condition which required my urgent intervention.     CONSULTS:  None    PROCEDURES:  Unless otherwise noted below, none     Procedures      FINAL IMPRESSION      1. Moderate  persistent asthma with exacerbation    2. Viral URI          DISPOSITION/PLAN   DISPOSITION Decision To Discharge 01/27/2023 07:40:05 AM      PATIENT REFERRED TO:  [facility]  [address]  [place] [address]  [phone]    or your PCP      DISCHARGE MEDICATIONS:  Current Discharge Medication List        START taking these medications    Details   dexAMETHasone (DECADRON) 4 MG tablet Take 2 tablets by mouth once as needed (wheezing/SOB) Take in 48 to 72 hours if continued difficulty breathing.  Qty: 2 tablet, Refills: 0  Controlled Substances Monitoring:          No data to display                (Please note that portions of this note were completed with a voice recognition program.  Efforts were made to edit the dictations but occasionally words are mis-transcribed.)    Caitlan Chauca Festus Barren, MD (electronically signed)  Attending Emergency Physician            Gerrit Halls, MD  01/27/23 2841       Gerrit Halls, MD  02/19/23 1414

## 2023-01-27 NOTE — Discharge Instructions (Signed)
Recommend taking extra strength Tylenol every 4-6 hours and ibuprofen 600 mg every 6 hours as needed for pain, fever and discomfort.  Consider sinus rinses or Afrin nasal spray for sinus congestion.  Use your inhaler as needed to help to open up your lungs and to help with coughing.
